# Patient Record
Sex: Male | Born: 1995 | Race: White | Hispanic: No | Marital: Single | Smoking: Former smoker
Health system: Southern US, Community
[De-identification: ages and names within clinical notes are randomized; demographics above are authoritative.]

## PROBLEM LIST (undated history)

## (undated) ENCOUNTER — Emergency Department: Discharge status: Absent without leave | Payer: Self-pay

## (undated) DIAGNOSIS — F909 Attention-deficit hyperactivity disorder, unspecified type: Secondary | ICD-10-CM

## (undated) DIAGNOSIS — K3532 Acute appendicitis with perforation and localized peritonitis, without abscess: Secondary | ICD-10-CM

## (undated) DIAGNOSIS — F1910 Other psychoactive substance abuse, uncomplicated: Secondary | ICD-10-CM

## (undated) DIAGNOSIS — K37 Unspecified appendicitis: Secondary | ICD-10-CM

## (undated) DIAGNOSIS — K3533 Acute appendicitis with perforation and localized peritonitis, with abscess: Secondary | ICD-10-CM

## (undated) HISTORY — DX: Acute appendicitis with perforation, localized peritonitis, and gangrene, without abscess: K35.32

## (undated) HISTORY — DX: Unspecified appendicitis: K37

## (undated) HISTORY — DX: Other psychoactive substance abuse, uncomplicated: F19.10

---
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
## 2014-01-25 ENCOUNTER — Emergency Department: Payer: Self-pay | Admitting: Emergency Medicine

## 2014-04-20 DIAGNOSIS — F1910 Other psychoactive substance abuse, uncomplicated: Secondary | ICD-10-CM

## 2014-04-20 HISTORY — DX: Other psychoactive substance abuse, uncomplicated: F19.10

## 2014-07-09 ENCOUNTER — Emergency Department: Payer: Self-pay | Admitting: Emergency Medicine

## 2017-01-10 DIAGNOSIS — K3533 Acute appendicitis with perforation and localized peritonitis, with abscess: Secondary | ICD-10-CM

## 2017-01-10 HISTORY — DX: Acute appendicitis with perforation, localized peritonitis, and gangrene, with abscess: K35.33

## 2017-01-13 ENCOUNTER — Encounter: Payer: Self-pay | Admitting: Intensive Care

## 2017-01-13 ENCOUNTER — Inpatient Hospital Stay
Admission: EM | Admit: 2017-01-13 | Discharge: 2017-01-20 | DRG: 358 | Disposition: A | Discharge status: Home / Self Care | Payer: Self-pay | Attending: General Surgery | Admitting: General Surgery

## 2017-01-13 ENCOUNTER — Emergency Department: Payer: Self-pay

## 2017-01-13 DIAGNOSIS — R1084 Generalized abdominal pain: Secondary | ICD-10-CM

## 2017-01-13 DIAGNOSIS — L0291 Cutaneous abscess, unspecified: Secondary | ICD-10-CM

## 2017-01-13 DIAGNOSIS — Z833 Family history of diabetes mellitus: Secondary | ICD-10-CM

## 2017-01-13 DIAGNOSIS — Z8249 Family history of ischemic heart disease and other diseases of the circulatory system: Secondary | ICD-10-CM

## 2017-01-13 DIAGNOSIS — K3532 Acute appendicitis with perforation and localized peritonitis, without abscess: Secondary | ICD-10-CM | POA: Diagnosis present

## 2017-01-13 DIAGNOSIS — K353 Acute appendicitis with localized peritonitis: Principal | ICD-10-CM | POA: Diagnosis present

## 2017-01-13 DIAGNOSIS — K352 Acute appendicitis with generalized peritonitis: Secondary | ICD-10-CM

## 2017-01-13 HISTORY — DX: Attention-deficit hyperactivity disorder, unspecified type: F90.9

## 2017-01-13 LAB — URINALYSIS, COMPLETE (UACMP) WITH MICROSCOPIC
BILIRUBIN URINE: NEGATIVE
Bacteria, UA: NONE SEEN
Glucose, UA: NEGATIVE mg/dL
HGB URINE DIPSTICK: NEGATIVE
Ketones, ur: NEGATIVE mg/dL
LEUKOCYTES UA: NEGATIVE
NITRITE: NEGATIVE
PROTEIN: 30 mg/dL — AB
RBC / HPF: NONE SEEN RBC/hpf (ref 0–5)
Specific Gravity, Urine: 1.026 (ref 1.005–1.030)
Squamous Epithelial / LPF: NONE SEEN
pH: 6 (ref 5.0–8.0)

## 2017-01-13 LAB — COMPREHENSIVE METABOLIC PANEL
ALT: 22 U/L (ref 17–63)
ANION GAP: 9 (ref 5–15)
AST: 25 U/L (ref 15–41)
Albumin: 3.4 g/dL — ABNORMAL LOW (ref 3.5–5.0)
Alkaline Phosphatase: 96 U/L (ref 38–126)
BUN: 15 mg/dL (ref 6–20)
CHLORIDE: 98 mmol/L — AB (ref 101–111)
CO2: 29 mmol/L (ref 22–32)
Calcium: 9 mg/dL (ref 8.9–10.3)
Creatinine, Ser: 0.96 mg/dL (ref 0.61–1.24)
GFR calc non Af Amer: >60 mL/min (ref >60)
Glucose, Bld: 115 mg/dL — ABNORMAL HIGH (ref 65–99)
Potassium: 3.4 mmol/L — ABNORMAL LOW (ref 3.5–5.1)
SODIUM: 136 mmol/L (ref 135–145)
Total Bilirubin: 1.2 mg/dL (ref 0.3–1.2)
Total Protein: 7.9 g/dL (ref 6.5–8.1)

## 2017-01-13 LAB — CBC WITH DIFFERENTIAL/PLATELET
BASOS ABS: 0 10*3/uL (ref 0–0.1)
Basophils Relative: 0 %
Eosinophils Absolute: 0 10*3/uL (ref 0–0.7)
Eosinophils Relative: 0 %
HCT: 39.3 % — ABNORMAL LOW (ref 40.0–52.0)
Hemoglobin: 13.3 g/dL (ref 13.0–18.0)
LYMPHS ABS: 2.1 10*3/uL (ref 1.0–3.6)
Lymphocytes Relative: 8 %
MCH: 29 pg (ref 26.0–34.0)
MCHC: 33.9 g/dL (ref 32.0–36.0)
MCV: 85.6 fL (ref 80.0–100.0)
MONO ABS: 2.9 10*3/uL — AB (ref 0.2–1.0)
Monocytes Relative: 11 %
Neutro Abs: 21.1 10*3/uL — ABNORMAL HIGH (ref 1.4–6.5)
Neutrophils Relative %: 81 %
PLATELETS: 328 10*3/uL (ref 150–440)
RBC: 4.59 MIL/uL (ref 4.40–5.90)
RDW: 13.1 % (ref 11.5–14.5)
WBC: 26.1 10*3/uL — AB (ref 3.8–10.6)

## 2017-01-13 LAB — LIPASE, BLOOD: LIPASE: 11 U/L (ref 11–51)

## 2017-01-13 MED ORDER — DIPHENHYDRAMINE HCL 50 MG/ML IJ SOLN: 25.0000 mg | Freq: Four times a day (QID) | INTRAMUSCULAR | Status: DC | PRN

## 2017-01-13 MED ORDER — PIPERACILLIN-TAZOBACTAM 3.375 G IVPB: 3.3750 g | Freq: Three times a day (TID) | INTRAVENOUS | Status: DC

## 2017-01-13 MED ORDER — ONDANSETRON 4 MG PO TBDP
4.0000 mg | ORAL_TABLET | Freq: Four times a day (QID) | ORAL | Status: DC | PRN
Start: 2017-01-13 — End: 2017-01-14

## 2017-01-13 MED ORDER — MORPHINE SULFATE (PF) 4 MG/ML IV SOLN
INTRAVENOUS | Status: AC
  Administered 2017-01-13: 4 mg via INTRAVENOUS
  Filled 2017-01-13: qty 1

## 2017-01-13 MED ORDER — HYDRALAZINE HCL 20 MG/ML IJ SOLN: 10.0000 mg | INTRAMUSCULAR | Status: DC | PRN

## 2017-01-13 MED ORDER — DIPHENHYDRAMINE HCL 25 MG PO CAPS: 25.0000 mg | ORAL_CAPSULE | Freq: Four times a day (QID) | ORAL | Status: DC | PRN

## 2017-01-13 MED ORDER — ONDANSETRON HCL 4 MG/2ML IJ SOLN
4.0000 mg | Freq: Four times a day (QID) | INTRAMUSCULAR | Status: DC | PRN
Start: 2017-01-13 — End: 2017-01-14
  Administered 2017-01-14: 4 mg via INTRAVENOUS
  Filled 2017-01-13: qty 2

## 2017-01-13 MED ORDER — MELATONIN 5 MG PO TABS
10.0000 mg | ORAL_TABLET | Freq: Every evening | ORAL | Status: DC | PRN
  Administered 2017-01-13 – 2017-01-20 (×5): 10 mg via ORAL
  Filled 2017-01-13 (×6): qty 2

## 2017-01-13 MED ORDER — PIPERACILLIN-TAZOBACTAM 3.375 G IVPB
INTRAVENOUS | Status: AC
  Administered 2017-01-13: 3.375 g via INTRAVENOUS
  Filled 2017-01-13: qty 50

## 2017-01-13 MED ORDER — MORPHINE SULFATE (PF) 4 MG/ML IV SOLN
4.0000 mg | Freq: Once | INTRAVENOUS | Status: AC
  Administered 2017-01-13: 4 mg via INTRAVENOUS

## 2017-01-13 MED ORDER — IOPAMIDOL (ISOVUE-300) INJECTION 61%
30.0000 mL | Freq: Once | INTRAVENOUS | Status: AC | PRN
  Administered 2017-01-13: 30 mL via ORAL
  Filled 2017-01-13: qty 30

## 2017-01-13 MED ORDER — DEXTROSE IN LACTATED RINGERS 5 % IV SOLN
INTRAVENOUS | Status: DC
  Administered 2017-01-13 – 2017-01-17 (×8): via INTRAVENOUS

## 2017-01-13 MED ORDER — ENOXAPARIN SODIUM 40 MG/0.4ML ~~LOC~~ SOLN
40.0000 mg | SUBCUTANEOUS | Status: DC
  Administered 2017-01-13 – 2017-01-19 (×7): 40 mg via SUBCUTANEOUS
  Filled 2017-01-13 (×7): qty 0.4

## 2017-01-13 MED ORDER — DICYCLOMINE HCL 10 MG PO CAPS
10.0000 mg | ORAL_CAPSULE | Freq: Once | ORAL | Status: AC
  Administered 2017-01-13: 10 mg via ORAL
  Filled 2017-01-13: qty 1

## 2017-01-13 MED ORDER — DEXTROSE 5 % IV SOLN
3.3750 g | Freq: Three times a day (TID) | INTRAVENOUS | Status: DC
  Administered 2017-01-13 – 2017-01-14 (×2): 3.375 g via INTRAVENOUS
  Filled 2017-01-13 (×6): qty 3.38

## 2017-01-13 MED ORDER — ACETAMINOPHEN 325 MG PO TABS
650.0000 mg | ORAL_TABLET | ORAL | Status: DC | PRN
  Administered 2017-01-13: 650 mg via ORAL
  Filled 2017-01-13: qty 2

## 2017-01-13 MED ORDER — MORPHINE SULFATE (PF) 4 MG/ML IV SOLN
4.0000 mg | INTRAVENOUS | Status: DC | PRN
  Administered 2017-01-13 – 2017-01-15 (×10): 4 mg via INTRAVENOUS
  Filled 2017-01-13 (×9): qty 1

## 2017-01-13 MED ORDER — PIPERACILLIN-TAZOBACTAM 3.375 G IVPB 30 MIN
3.3750 g | Freq: Once | INTRAVENOUS | Status: AC
  Administered 2017-01-13: 3.375 g via INTRAVENOUS

## 2017-01-13 MED ORDER — IOPAMIDOL (ISOVUE-300) INJECTION 61%
100.0000 mL | Freq: Once | INTRAVENOUS | Status: AC | PRN
  Administered 2017-01-13: 100 mL via INTRAVENOUS
  Filled 2017-01-13: qty 100

## 2017-01-13 NOTE — H&P (Signed)
Patient ID: Jimmy Leach, male   DOB: 15-Aug-1996, 21 y.o.   MRN: [id number]  CC: ABDOMINAL PAIN  HPI [name] is a 21 y.o. male who presented to the emergency department today for abdominal pain. Patient states he's been having some abdominal pain for the last 3 weeks but today the pain worsened dramatically to the point that he could not handle it anymore. He states that the pain actually peaked on Sunday but then went away and allowed to be traversed have many days. He has been using marijuana for anxiety and pain control. She is a decreased appetite, nausea, vomiting, loose stools. He denies any fevers but he has had chills. Denies any chest pain, shortness of breath, cough. He is otherwise in his usual state of excellent health.  HPI  Past Medical History:  Diagnosis Date  . ADHD     History reviewed. No pertinent surgical history. No prior surgeries.  History reviewed. No pertinent family history. Patient reports a remote family history of heart disease and diabetes. He denies any known history of cancers.  Social History Social History  Substance Use Topics  . Smoking status: Never Smoker  . Smokeless tobacco: Never Used  . Alcohol use Yes     Comment: occ    No Known Allergies  Current Facility-Administered Medications  Medication Dose Route Frequency Provider Last Rate Last Dose  . piperacillin-tazobactam (ZOSYN) IVPB 3.375 g  3.375 g Intravenous Once Phineas Semen, MD 100 mL/hr at 01/13/17 1810 3.375 g at 01/13/17 1810   No current outpatient prescriptions on file.     Review of Systems A Multi-point review of systems was asked and was negative except for the findings documented in the history of present illness  Physical Exam Blood pressure 125/88, pulse 100, temperature 100 F (37.8 C), temperature source Oral, resp. rate 20, height  (1.702 m), weight 65.8 kg (145 lb), SpO2 98 %. CONSTITUTIONAL: No acute distress. EYES: Pupils are equal,  round, and reactive to light, Sclera are non-icteric. EARS, NOSE, MOUTH AND THROAT: The oropharynx is clear. The oral mucosa is pink and moist. Hearing is intact to voice. LYMPH NODES:  Lymph nodes in the neck are normal. RESPIRATORY:  Lungs are clear. There is normal respiratory effort, with equal breath sounds bilaterally, and without pathologic use of accessory muscles. CARDIOVASCULAR: Heart is regular without murmurs, gallops, or rubs. GI: The abdomen is soft, tender to palpation in the right lower quadrant with a positive McBurney sign but negative Rovsing or heeltap, and nondistended. There are no palpable masses. There is no hepatosplenomegaly. There are normal bowel sounds in all quadrants. GU: Rectal deferred.   MUSCULOSKELETAL: Normal muscle strength and tone. No cyanosis or edema.   SKIN: Turgor is good and there are no pathologic skin lesions or ulcers. NEUROLOGIC: Motor and sensation is grossly normal. Cranial nerves are grossly intact. PSYCH:  Oriented to person, place and time. Affect is normal.  Data Reviewed Images and labs reviewed. Labs concerning for leukocytosis of 26.1, mild hypokalemia at 3.4, hypochloremia of 98. CT scan of the abdomen shows a large inflammatory process in the right lower quadrant with what appears to be a fecalith within the middle of it consistent with a ruptured appendicitis with periappendiceal abscess. I have personally reviewed the patient's imaging, laboratory findings and medical records.    Assessment    Ruptured appendicitis    Plan    21 year old male with a ruptured appendicitis. Discussed the diagnosis at length and  in detail to the patient to include the treatment options. The patient's CT scan was reviewed with the interventional radiologist who feels there is not a safe window to drain this abscess percutaneously. Given this discussed with patient the need to admit him to the hospital for resuscitation and IV antibiotics. After he has  been resuscitated overnight with implant taken to the operating room for attempted laparoscopic appendectomy and drainage of pelvic abscess. Discussed at length with the patient the high risk for needing to perform an open procedure. Discussed the likelihood of him requiring a prolonged hospital stay secondary to the abscess. Patient voiced understanding and agrees with this plan. He will be admitted to the surgical service, lot have a clear liquid diet until midnight and then nothing by mouth, IV antibiotics, IV hydration, proceed to the operating room in the morning for surgery.     Time spent with the patient was 50 minutes, with more than 50% of the time spent in face-to-face education, counseling and care coordination.     Ricarda Frame, MD FACS General Surgeon 01/13/2017, 6:34 PM

## 2017-01-13 NOTE — ED Notes (Signed)
Pt reports his pain is returning, pt states he is not able to get comfortable. EDP notified, VS stable. See MAR for follow up. Pt notified pain medication is ordered every 4hrs and this dose being administered at this time is to help with comfort and transport to room. Pt verbalized understanding of this.

## 2017-01-13 NOTE — ED Triage Notes (Signed)
Patient presents to ER with c/o abdominal pain all over that started X3 weeks ago. Reports pain is worse in AM. Has been having N/V/D. Ambulatory with NAD noted

## 2017-01-13 NOTE — ED Notes (Signed)
Dr. Woodham at bedside. 

## 2017-01-13 NOTE — ED Notes (Signed)
EDP at bedside  

## 2017-01-13 NOTE — ED Provider Notes (Signed)
[facility] Emergency Department Provider Note   ____________________________________________   I have reviewed the triage vital signs and the nursing notes.   HISTORY  Chief Complaint Abdominal Pain   History limited by: Not Limited   HPI Jimmy Leach is a 21 y.o. male who presents to the emergency department today because of concern for abdominal pain. The pain has been present for the past three weeks. It was located initially in the upper part of the abdomen but has "rolled" down his stomach. It has been accompanied by nausea and vomiting and is usually worse in the morning. He has had a decreased apetite with the pain. He has not had any significant diarrhea, blood in stool or change in stooling. No fevers. He has tried ibuprofen, pepto bismal, immodium and laxative without any significant relief. Has not had pain like this before.    Past Medical History:  Diagnosis Date  . ADHD     There are no active problems to display for this patient.   History reviewed. No pertinent surgical history.  Prior to Admission medications   Not on File    Allergies Patient has no known allergies.  History reviewed. No pertinent family history.  Social History Social History  Substance Use Topics  . Smoking status: Never Smoker  . Smokeless tobacco: Never Used  . Alcohol use Yes     Comment: occ    Review of Systems  Constitutional: Negative for fever. Cardiovascular: Negative for chest pain. Respiratory: Negative for shortness of breath. Gastrointestinal: Positive for abdominal pain, nausea and vomiting. Genitourinary: Negative for dysuria. Musculoskeletal: Negative for back pain. Skin: Negative for rash. Neurological: Negative for headaches, focal weakness or numbness.  10-point ROS otherwise negative.  ____________________________________________   PHYSICAL EXAM:  VITAL SIGNS: ED Triage Vitals  Enc Vitals Group     BP 01/13/17  1412 137/82     Pulse Rate 01/13/17 1412 96     Resp 01/13/17 1412 20     Temp 01/13/17 1412 99.6 F (37.6 C)     Temp Source 01/13/17 1412 Oral     SpO2 01/13/17 1412 98 %     Weight 01/13/17 1413 145 lb (65.8 kg)     Height 01/13/17 1413  (1.702 m)     Head Circumference --      Peak Flow --      Pain Score 01/13/17 1420 2    Constitutional: Alert and oriented. Well appearing and in no distress. Eyes: Conjunctivae are normal. Normal extraocular movements. ENT   Head: Normocephalic and atraumatic.   Nose: No congestion/rhinnorhea.   Mouth/Throat: Mucous membranes are moist.   Neck: No stridor. Hematological/Lymphatic/Immunilogical: No cervical lymphadenopathy. Cardiovascular: Normal rate, regular rhythm.  No murmurs, rubs, or gallops.  Respiratory: Normal respiratory effort without tachypnea nor retractions. Breath sounds are clear and equal bilaterally. No wheezes/rales/rhonchi. Gastrointestinal: Soft and diffusely tender to palpation. No rebound. No guarding.  Genitourinary: Deferred Musculoskeletal: Normal range of motion in all extremities. No lower extremity edema. Neurologic:  Normal speech and language. No gross focal neurologic deficits are appreciated.  Skin:  Skin is warm, dry and intact. No rash noted. Psychiatric: Mood and affect are normal. Speech and behavior are normal. Patient exhibits appropriate insight and judgment.  ____________________________________________    LABS (pertinent positives/negatives)  Labs Reviewed  COMPREHENSIVE METABOLIC PANEL - Abnormal; Notable for the following:       Result Value   Potassium 3.4 (*)    Chloride 98 (*)  Glucose, Bld 115 (*)    Albumin 3.4 (*)    All other components within normal limits  URINALYSIS, COMPLETE (UACMP) WITH MICROSCOPIC - Abnormal; Notable for the following:    Color, Urine AMBER (*)    APPearance CLEAR (*)    Protein, ur 30 (*)    All other components within normal limits  CBC  WITH DIFFERENTIAL/PLATELET - Abnormal; Notable for the following:    WBC 26.1 (*)    HCT 39.3 (*)    Neutro Abs 21.1 (*)    Monocytes Absolute 2.9 (*)    All other components within normal limits  LIPASE, BLOOD     ____________________________________________   EKG  None  ____________________________________________    RADIOLOGY  CT abd/pel IMPRESSION:  Large inflammatory process identified in the RIGHT pelvis adjacent  to the cecum and terminal ileum most likely representing perforated  appendicitis with secondary inflammatory changes and a large  periappendiceal abscess 7.0 x 4.3 x 6.8 cm.     I, Mariam Helbert, personally discussed these images and results by phone with the on-call radiologist and used this discussion as part of my medical decision making.  ____________________________________________   PROCEDURES  Procedures  ____________________________________________   INITIAL IMPRESSION / ASSESSMENT AND PLAN / ED COURSE  Pertinent labs & imaging results that were available during my care of the patient were reviewed by me and considered in my medical decision making (see chart for details).  Patient presented to the emergency department today because of concerns for abdominal pain this been going on for the past couple of weeks. Exam the patient had somewhat diffuse abdominal tenderness. He had a significant leukocytosis and the CT scan was obtained. This did show findings perhaps consistent with a ruptured appendicitis and abscess. Patient will be admitted under the care of the surgical team.  ____________________________________________   FINAL CLINICAL IMPRESSION(S) / ED DIAGNOSES  Final diagnoses:  Generalized abdominal pain  Abscess     Note: This dictation was prepared with Dragon dictation. Any transcriptional errors that result from this process are unintentional     Phineas Semen, MD 01/13/17 1756

## 2017-01-14 ENCOUNTER — Encounter
Admission: EM | Disposition: A | Discharge status: Home / Self Care | Payer: Self-pay | Source: Home / Self Care | Attending: General Surgery

## 2017-01-14 ENCOUNTER — Inpatient Hospital Stay: Payer: Self-pay | Admitting: Certified Registered"

## 2017-01-14 ENCOUNTER — Encounter: Payer: Self-pay | Admitting: Anesthesiology

## 2017-01-14 DIAGNOSIS — L0291 Cutaneous abscess, unspecified: Secondary | ICD-10-CM

## 2017-01-14 HISTORY — PX: LAPAROSCOPIC APPENDECTOMY: SHX408

## 2017-01-14 LAB — CBC
HCT: 35.7 % — ABNORMAL LOW (ref 40.0–52.0)
Hemoglobin: 12 g/dL — ABNORMAL LOW (ref 13.0–18.0)
MCH: 28.7 pg (ref 26.0–34.0)
MCHC: 33.6 g/dL (ref 32.0–36.0)
MCV: 85.3 fL (ref 80.0–100.0)
PLATELETS: 291 10*3/uL (ref 150–440)
RBC: 4.19 MIL/uL — AB (ref 4.40–5.90)
RDW: 12.8 % (ref 11.5–14.5)
WBC: 22 10*3/uL — AB (ref 3.8–10.6)

## 2017-01-14 LAB — SURGICAL PCR SCREEN
MRSA, PCR: NEGATIVE
STAPHYLOCOCCUS AUREUS: POSITIVE — AB

## 2017-01-14 LAB — COMPREHENSIVE METABOLIC PANEL
ALT: 16 U/L — AB (ref 17–63)
AST: 17 U/L (ref 15–41)
Albumin: 2.7 g/dL — ABNORMAL LOW (ref 3.5–5.0)
Alkaline Phosphatase: 80 U/L (ref 38–126)
Anion gap: 5 (ref 5–15)
BILIRUBIN TOTAL: 1.6 mg/dL — AB (ref 0.3–1.2)
BUN: 8 mg/dL (ref 6–20)
CO2: 28 mmol/L (ref 22–32)
CREATININE: 0.85 mg/dL (ref 0.61–1.24)
Calcium: 8.3 mg/dL — ABNORMAL LOW (ref 8.9–10.3)
Chloride: 101 mmol/L (ref 101–111)
Glucose, Bld: 141 mg/dL — ABNORMAL HIGH (ref 65–99)
POTASSIUM: 3.3 mmol/L — AB (ref 3.5–5.1)
Sodium: 134 mmol/L — ABNORMAL LOW (ref 135–145)
TOTAL PROTEIN: 6.5 g/dL (ref 6.5–8.1)

## 2017-01-14 SURGERY — APPENDECTOMY, LAPAROSCOPIC
Anesthesia: General | Wound class: Clean Contaminated

## 2017-01-14 MED ORDER — PIPERACILLIN-TAZOBACTAM 3.375 G IVPB
INTRAVENOUS | Status: AC
  Administered 2017-01-14: 3.375 g via INTRAVENOUS
  Filled 2017-01-14: qty 50

## 2017-01-14 MED ORDER — FENTANYL CITRATE (PF) 100 MCG/2ML IJ SOLN: 25.0000 ug | INTRAMUSCULAR | Status: DC | PRN

## 2017-01-14 MED ORDER — BUPIVACAINE-EPINEPHRINE (PF) 0.25% -1:200000 IJ SOLN
INTRAMUSCULAR | Status: AC
  Filled 2017-01-14: qty 30

## 2017-01-14 MED ORDER — MIDAZOLAM HCL 2 MG/2ML IJ SOLN
INTRAMUSCULAR | Status: AC
  Filled 2017-01-14: qty 2

## 2017-01-14 MED ORDER — LIDOCAINE HCL (CARDIAC) 20 MG/ML IV SOLN
INTRAVENOUS | Status: DC | PRN
  Administered 2017-01-14: 60 mg via INTRAVENOUS

## 2017-01-14 MED ORDER — SUGAMMADEX SODIUM 200 MG/2ML IV SOLN
INTRAVENOUS | Status: DC | PRN
  Administered 2017-01-14: 120 mg via INTRAVENOUS

## 2017-01-14 MED ORDER — FENTANYL CITRATE (PF) 100 MCG/2ML IJ SOLN
INTRAMUSCULAR | Status: AC
  Administered 2017-01-14: 25 ug via INTRAVENOUS
  Filled 2017-01-14: qty 2

## 2017-01-14 MED ORDER — LACTATED RINGERS IV SOLN
INTRAVENOUS | Status: DC | PRN
  Administered 2017-01-14: 12:00:00 via INTRAVENOUS

## 2017-01-14 MED ORDER — SUGAMMADEX SODIUM 200 MG/2ML IV SOLN
INTRAVENOUS | Status: AC
  Filled 2017-01-14: qty 2

## 2017-01-14 MED ORDER — HYDROCODONE-ACETAMINOPHEN 5-325 MG PO TABS
1.0000 | ORAL_TABLET | ORAL | Status: DC | PRN
Start: 2017-01-14 — End: 2017-01-20
  Administered 2017-01-14 – 2017-01-15 (×4): 1 via ORAL
  Administered 2017-01-16 – 2017-01-19 (×8): 2 via ORAL
  Administered 2017-01-19: 1 via ORAL
  Administered 2017-01-19 – 2017-01-20 (×2): 2 via ORAL
  Filled 2017-01-14: qty 1
  Filled 2017-01-14 (×3): qty 2
  Filled 2017-01-14: qty 1
  Filled 2017-01-14: qty 2
  Filled 2017-01-14: qty 1
  Filled 2017-01-14 (×7): qty 2
  Filled 2017-01-14 (×4): qty 1

## 2017-01-14 MED ORDER — SEVOFLURANE IN SOLN
RESPIRATORY_TRACT | Status: AC
  Filled 2017-01-14: qty 250

## 2017-01-14 MED ORDER — DEXAMETHASONE SODIUM PHOSPHATE 10 MG/ML IJ SOLN
INTRAMUSCULAR | Status: DC | PRN
  Administered 2017-01-14: 10 mg via INTRAVENOUS

## 2017-01-14 MED ORDER — OXYCODONE HCL 5 MG/5ML PO SOLN: 5.0000 mg | Freq: Once | ORAL | Status: DC | PRN

## 2017-01-14 MED ORDER — PROPOFOL 10 MG/ML IV BOLUS
INTRAVENOUS | Status: DC | PRN
  Administered 2017-01-14: 160 mg via INTRAVENOUS

## 2017-01-14 MED ORDER — ONDANSETRON HCL 4 MG/2ML IJ SOLN
4.0000 mg | Freq: Once | INTRAMUSCULAR | Status: AC | PRN
  Administered 2017-01-16: 4 mg via INTRAVENOUS
  Filled 2017-01-14: qty 2

## 2017-01-14 MED ORDER — ROCURONIUM BROMIDE 100 MG/10ML IV SOLN
INTRAVENOUS | Status: DC | PRN
  Administered 2017-01-14: 10 mg via INTRAVENOUS
  Administered 2017-01-14: 40 mg via INTRAVENOUS
  Administered 2017-01-14: 10 mg via INTRAVENOUS

## 2017-01-14 MED ORDER — LIDOCAINE HCL (PF) 1 % IJ SOLN
INTRAMUSCULAR | Status: AC
  Filled 2017-01-14: qty 30

## 2017-01-14 MED ORDER — FENTANYL CITRATE (PF) 100 MCG/2ML IJ SOLN
INTRAMUSCULAR | Status: DC | PRN
  Administered 2017-01-14: 100 ug via INTRAVENOUS
  Administered 2017-01-14: 25 ug via INTRAVENOUS
  Administered 2017-01-14: 50 ug via INTRAVENOUS
  Administered 2017-01-14: 25 ug via INTRAVENOUS

## 2017-01-14 MED ORDER — FENTANYL CITRATE (PF) 100 MCG/2ML IJ SOLN
25.0000 ug | INTRAMUSCULAR | Status: DC | PRN
  Administered 2017-01-14 (×2): 25 ug via INTRAVENOUS
  Administered 2017-01-14: 50 ug via INTRAVENOUS
  Administered 2017-01-14 (×2): 25 ug via INTRAVENOUS

## 2017-01-14 MED ORDER — FENTANYL CITRATE (PF) 100 MCG/2ML IJ SOLN
INTRAMUSCULAR | Status: AC
  Filled 2017-01-14: qty 2

## 2017-01-14 MED ORDER — LIDOCAINE HCL (PF) 2 % IJ SOLN
INTRAMUSCULAR | Status: AC
  Filled 2017-01-14: qty 2

## 2017-01-14 MED ORDER — ONDANSETRON HCL 4 MG/2ML IJ SOLN
INTRAMUSCULAR | Status: DC | PRN
  Administered 2017-01-14: 4 mg via INTRAVENOUS

## 2017-01-14 MED ORDER — DEXAMETHASONE SODIUM PHOSPHATE 10 MG/ML IJ SOLN
INTRAMUSCULAR | Status: AC
  Filled 2017-01-14: qty 1

## 2017-01-14 MED ORDER — OXYCODONE HCL 5 MG PO TABS: 5.0000 mg | ORAL_TABLET | Freq: Once | ORAL | Status: DC | PRN

## 2017-01-14 MED ORDER — ACETAMINOPHEN 10 MG/ML IV SOLN
INTRAVENOUS | Status: DC | PRN
  Administered 2017-01-14: 1000 mg via INTRAVENOUS

## 2017-01-14 MED ORDER — CHLORHEXIDINE GLUCONATE CLOTH 2 % EX PADS
6.0000 | MEDICATED_PAD | Freq: Every day | CUTANEOUS | Status: DC
  Administered 2017-01-14: 6 via TOPICAL

## 2017-01-14 MED ORDER — BUPIVACAINE-EPINEPHRINE (PF) 0.25% -1:200000 IJ SOLN
INTRAMUSCULAR | Status: DC | PRN
  Administered 2017-01-14: 7 mL

## 2017-01-14 MED ORDER — PROPOFOL 10 MG/ML IV BOLUS
INTRAVENOUS | Status: AC
  Filled 2017-01-14: qty 20

## 2017-01-14 MED ORDER — SODIUM CHLORIDE 0.9 % IV SOLN
30.0000 meq | Freq: Once | INTRAVENOUS | Status: AC
  Administered 2017-01-14: 30 meq via INTRAVENOUS
  Filled 2017-01-14: qty 15

## 2017-01-14 MED ORDER — MUPIROCIN 2 % EX OINT
1.0000 "application " | TOPICAL_OINTMENT | Freq: Two times a day (BID) | CUTANEOUS | Status: AC
  Administered 2017-01-14 – 2017-01-18 (×9): 1 via NASAL
  Filled 2017-01-14: qty 22

## 2017-01-14 MED ORDER — MIDAZOLAM HCL 2 MG/2ML IJ SOLN
INTRAMUSCULAR | Status: DC | PRN
  Administered 2017-01-14: 2 mg via INTRAVENOUS

## 2017-01-14 MED ORDER — ACETAMINOPHEN 10 MG/ML IV SOLN
INTRAVENOUS | Status: AC
  Filled 2017-01-14: qty 100

## 2017-01-14 MED ORDER — KETOROLAC TROMETHAMINE 30 MG/ML IJ SOLN
INTRAMUSCULAR | Status: DC | PRN
  Administered 2017-01-14: 30 mg via INTRAVENOUS

## 2017-01-14 MED ORDER — ROCURONIUM BROMIDE 50 MG/5ML IV SOLN
INTRAVENOUS | Status: AC
  Filled 2017-01-14: qty 1

## 2017-01-14 MED ORDER — ONDANSETRON HCL 4 MG/2ML IJ SOLN
INTRAMUSCULAR | Status: AC
  Filled 2017-01-14: qty 2

## 2017-01-14 MED ORDER — LIDOCAINE HCL 1 % IJ SOLN
INTRAMUSCULAR | Status: DC | PRN
  Administered 2017-01-14: 7 mL

## 2017-01-14 MED ORDER — KETOROLAC TROMETHAMINE 30 MG/ML IJ SOLN
INTRAMUSCULAR | Status: AC
  Filled 2017-01-14: qty 1

## 2017-01-14 MED ORDER — PIPERACILLIN-TAZOBACTAM 3.375 G IVPB 30 MIN
3.3750 g | Freq: Three times a day (TID) | INTRAVENOUS | Status: DC
  Administered 2017-01-14 – 2017-01-20 (×17): 3.375 g via INTRAVENOUS
  Filled 2017-01-14 (×20): qty 50

## 2017-01-14 MED ORDER — PIPERACILLIN-TAZOBACTAM 3.375 G IVPB
3.3750 g | Freq: Once | INTRAVENOUS | Status: AC
  Administered 2017-01-14: 3.375 g via INTRAVENOUS

## 2017-01-14 SURGICAL SUPPLY — 58 items
ADHESIVE MASTISOL STRL (MISCELLANEOUS) ×3 IMPLANT
APPLIER CLIP 5 13 M/L LIGAMAX5 (MISCELLANEOUS)
BLADE SURG SZ11 CARB STEEL (BLADE) ×3 IMPLANT
BULB RESERV EVAC DRAIN JP 100C (MISCELLANEOUS) ×6 IMPLANT
CANISTER SUCT 1200ML W/VALVE (MISCELLANEOUS) ×3 IMPLANT
CATH TRAY 16F METER LATEX (MISCELLANEOUS) ×3 IMPLANT
CHLORAPREP W/TINT 26ML (MISCELLANEOUS) ×3 IMPLANT
CLIP APPLIE 5 13 M/L LIGAMAX5 (MISCELLANEOUS) IMPLANT
CLOSURE WOUND 1/2 X4 (GAUZE/BANDAGES/DRESSINGS)
CUTTER FLEX LINEAR 45M (STAPLE) IMPLANT
DRAIN CHANNEL JP 19F (MISCELLANEOUS) ×6 IMPLANT
DRAPE LAPAROTOMY 100X77 ABD (DRAPES) ×3 IMPLANT
DRSG TEGADERM 2-3/8X2-3/4 SM (GAUZE/BANDAGES/DRESSINGS) ×9 IMPLANT
DRSG TELFA 3X8 NADH (GAUZE/BANDAGES/DRESSINGS) IMPLANT
DRSG TELFA 4X3 1S NADH ST (GAUZE/BANDAGES/DRESSINGS) ×3 IMPLANT
ELECT REM PT RETURN 9FT ADLT (ELECTROSURGICAL) ×3
ELECTRODE REM PT RTRN 9FT ADLT (ELECTROSURGICAL) ×1 IMPLANT
ENDOPOUCH RETRIEVER 10 (MISCELLANEOUS) ×3 IMPLANT
GAUZE SPONGE 4X4 12PLY STRL (GAUZE/BANDAGES/DRESSINGS) IMPLANT
GLOVE BIO SURGEON STRL SZ7.5 (GLOVE) ×18 IMPLANT
GLOVE BIO SURGEON STRL SZ8 (GLOVE) ×3 IMPLANT
GLOVE INDICATOR 8.0 STRL GRN (GLOVE) ×3 IMPLANT
GOWN STRL REUS W/ TWL LRG LVL3 (GOWN DISPOSABLE) ×5 IMPLANT
GOWN STRL REUS W/TWL LRG LVL3 (GOWN DISPOSABLE) ×10
IRRIGATION STRYKERFLOW (MISCELLANEOUS) IMPLANT
IRRIGATOR STRYKERFLOW (MISCELLANEOUS)
IV NS 1000ML (IV SOLUTION) ×2
IV NS 1000ML BAXH (IV SOLUTION) ×1 IMPLANT
KIT RM TURNOVER STRD PROC AR (KITS) ×3 IMPLANT
LABEL OR EYE ROOM STRL (LABEL) IMPLANT
LABEL OR SOLS (LABEL) ×3 IMPLANT
NDL SAFETY 22GX1.5 (NEEDLE) IMPLANT
NEEDLE HYPO 25X1 1.5 SAFETY (NEEDLE) ×3 IMPLANT
NEEDLE VERESS 14GA 120MM (NEEDLE) ×3 IMPLANT
NS IRRIG 1000ML POUR BTL (IV SOLUTION) ×3 IMPLANT
NS IRRIG 500ML POUR BTL (IV SOLUTION) ×3 IMPLANT
PACK BASIN MINOR ARMC (MISCELLANEOUS) IMPLANT
PACK LAP CHOLECYSTECTOMY (MISCELLANEOUS) ×3 IMPLANT
RELOAD 45 VASCULAR/THIN (ENDOMECHANICALS) IMPLANT
RELOAD STAPLE TA45 3.5 REG BLU (ENDOMECHANICALS) IMPLANT
SCALPEL HARMONIC ACE (MISCELLANEOUS) ×3 IMPLANT
SLEEVE ENDOPATH XCEL 5M (ENDOMECHANICALS) ×3 IMPLANT
SPONGE DRAIN TRACH 4X4 STRL 2S (GAUZE/BANDAGES/DRESSINGS) ×6 IMPLANT
SPONGE LAP 18X18 5 PK (GAUZE/BANDAGES/DRESSINGS) IMPLANT
STAPLER SKIN PROX 35W (STAPLE) IMPLANT
STRIP CLOSURE SKIN 1/2X4 (GAUZE/BANDAGES/DRESSINGS) IMPLANT
SUT MNCRL 4-0 (SUTURE) ×2
SUT MNCRL 4-0 27XMFL (SUTURE) ×1
SUT VIC AB 2-0 CT1 27 (SUTURE)
SUT VIC AB 2-0 CT1 TAPERPNT 27 (SUTURE) IMPLANT
SUT VICRYL 0 TIES 12 18 (SUTURE) IMPLANT
SUTURE MNCRL 4-0 27XMF (SUTURE) ×1 IMPLANT
SWAB CULTURE AMIES ANAERIB BLU (MISCELLANEOUS) IMPLANT
SYR BULB IRRIG 60ML STRL (SYRINGE) IMPLANT
SYRINGE 10CC LL (SYRINGE) IMPLANT
TROCAR XCEL 12X100 BLDLESS (ENDOMECHANICALS) ×3 IMPLANT
TROCAR XCEL NON-BLD 5MMX100MML (ENDOMECHANICALS) ×3 IMPLANT
TUBING INSUFFLATOR HI FLOW (MISCELLANEOUS) ×3 IMPLANT

## 2017-01-14 NOTE — Transfer of Care (Signed)
Immediate Anesthesia Transfer of Care Note  Patient: Jimmy Leach  Procedure(s) Performed: Procedure(s): LAPAROSCOPIC DRAINAGE OF PELVIC ABSCESS (N/A)  Patient Location: PACU  Anesthesia Type:General  Level of Consciousness: awake, alert  and oriented  Airway & Oxygen Therapy: Patient Spontanous Breathing and Patient connected to nasal cannula oxygen  Post-op Assessment: Report given to RN and Post -op Vital signs reviewed and stable  Post vital signs: Reviewed and stable  Last Vitals:  Vitals:   01/14/17 1201 01/14/17 1412  BP: 122/77 123/80  Pulse: 94 97  Resp: 16 20  Temp: 37.9 C 37.3 C    Last Pain:  Vitals:   01/14/17 1201  TempSrc: Tympanic  PainSc: 7       Patients Stated Pain Goal: 2 (01/13/17 2235)  Complications: No apparent anesthesia complications

## 2017-01-14 NOTE — Anesthesia Preprocedure Evaluation (Addendum)
Anesthesia Evaluation  Patient identified by MRN, date of birth, ID band Patient awake    Reviewed: Allergy & Precautions, H&P , NPO status , Patient's Chart, lab work & pertinent test results, reviewed documented beta blocker date and time   Airway Mallampati: II  TM Distance: >3 FB Neck ROM: full    Dental  (+) Chipped, Poor Dentition   Pulmonary neg shortness of breath, Current Smoker,    Pulmonary exam normal breath sounds clear to auscultation       Cardiovascular Exercise Tolerance: Good (-) angina(-) Past MI and (-) DOE negative cardio ROS Normal cardiovascular exam Rhythm:regular Rate:Normal     Neuro/Psych PSYCHIATRIC DISORDERS Anxiety negative neurological ROS     GI/Hepatic Neg liver ROS, GERD  Controlled,  Endo/Other  negative endocrine ROS  Renal/GU      Musculoskeletal   Abdominal   Peds  Hematology negative hematology ROS (+)   Anesthesia Other Findings Past Medical History: No date: ADHD  History reviewed. No pertinent surgical history.  BMI    Body Mass Index:  22.88 kg/m      Reproductive/Obstetrics negative OB ROS                            Anesthesia Physical Anesthesia Plan  ASA: III  Anesthesia Plan: General   Post-op Pain Management:    Induction: Intravenous, Rapid sequence and Cricoid pressure planned  Airway Management Planned: Oral ETT  Additional Equipment:   Intra-op Plan:   Post-operative Plan:   Informed Consent: I have reviewed the patients History and Physical, chart, labs and discussed the procedure including the risks, benefits and alternatives for the proposed anesthesia with the patient or authorized representative who has indicated his/her understanding and acceptance.   Dental Advisory Given  Plan Discussed with: CRNA  Anesthesia Plan Comments:        Anesthesia Quick Evaluation

## 2017-01-14 NOTE — Brief Op Note (Signed)
01/13/2017 - 01/14/2017  2:03 PM  PATIENT:  Jimmy Leach  21 y.o. male  PRE-OPERATIVE DIAGNOSIS:  APPENDICITIS  POST-OPERATIVE DIAGNOSIS:  APPENDICITIS  PROCEDURE:  Procedure(s): LAPAROSCOPIC DRAINAGE OF PELVIC ABSCESS (N/A)  SURGEON:  Surgeon(s) and Role:    * Ricarda Frame, MD - Primary  PHYSICIAN ASSISTANT:   ASSISTANTS: none   ANESTHESIA:   general  EBL:  Total I/O In: 567 [I.V.:549; IV Piggyback:18] Out: 660 [Urine:460; Blood:200]  BLOOD ADMINISTERED:none  DRAINS: (75fr) Blake drain(s) in the pelvis and right paracolic gutter   LOCAL MEDICATIONS USED:  MARCAINE   , XYLOCAINE  and Amount: 14 ml  SPECIMEN:  No Specimen  DISPOSITION OF SPECIMEN:  N/A  COUNTS:  YES  TOURNIQUET:  * No tourniquets in log *  DICTATION: .Dragon Dictation  PLAN OF CARE: return to inpatient status  PATIENT DISPOSITION:  PACU - hemodynamically stable.   Delay start of Pharmacological VTE agent (>24hrs) due to surgical blood loss or risk of bleeding: no

## 2017-01-14 NOTE — Op Note (Signed)
Pre-operative Diagnosis: Ruptured appendicitis  Post-operative Diagnosis: Ruptured appendicitis  Procedure performed: Laparoscopic drainage of pelvic abscess  Surgeon: Ricarda Frame   Assistants: None  Anesthesia: General endotracheal anesthesia  ASA Class: 2  Surgeon: Ricarda Frame, MD FACS  Anesthesia: Gen. with endotracheal tube  Assistant: None  Procedure Details  The patient was seen again in the Holding Room. The benefits, complications, treatment options, and expected outcomes were discussed with the patient. The risks of bleeding, infection, recurrence of symptoms, failure to resolve symptoms,  bowel injury, any of which could require further surgery were reviewed with the patient.   The patient was taken to Operating Room, identified as Jimmy Leach and the procedure verified.  A Time Out was held and the above information confirmed.  Prior to the induction of general anesthesia, antibiotic prophylaxis was administered. VTE prophylaxis was in place. General endotracheal anesthesia was then administered and tolerated well. After the induction, the abdomen was prepped with Chloraprep and draped in the sterile fashion. The patient was positioned in the supine position.  The procedure began with a left upper quadrant Veress needle approach. An area 2 finger breath below the costal margin in the midclavicular line was localized with a 50-50 mixture of 1% lidocaine and 0.5% Marcaine plain. The skin was incised with an 11 blade scalpel and using a Veress needle and pneumoperitoneum was established up to 15 mmHg. Using a 5 mm Optiview trocar entry in the peritoneal cavity was obtained. There is no evidence of damage from either the Veress needle or the Optiview trocar.  After this an infraumbilical 5 mm and a left lower quadrant 12 mm trocar were both placed under direct visualization after localization with the aforementioned local anesthetic. The patient was placed head  down and rotated to the left. Due to the extensive inflammation and into the pelvis adequate visualization was not able to be obtained with the camera in the left upper quadrant. At this point a low midline 5 mm trocar was placed under direct visualization after localization. This allowed the cecum and terminal ileum be identified. The ileum was noted to be extensively inflamed and involved with pelvic abscess. Pelvic abscess was entered into and the area was copiously irrigated until the irrigation returned clear. After extensive dissection the area that was thought to be where the appendix was was noted to be fully obliterated with the abscess cavity. The cecum and terminal ileum were clearly identified and there is no evidence of bowel lumen.  After multiple attempts the appendix was never identified and the decision was made to place [place] drains within the pelvic abscess cavity. 19 [place] Blake drains were placed into the pelvis and into the right pericolic gutter under direct visualization. They were through the low midline and the left lower quadrant incision site. There were secured to the skin with a 3-0 nylon suture. The pneumoperitoneum was then released and the remaining operative trochars were removed without any difficulty. These operative sites were closed with a 4-0 Monocryl suture and then sealed with Mastisol, Steri-Strips, Tegaderm.  The patient tolerated procedure well and was awoken from general anesthesia and the operative room. He was then transferred to the PACU in good condition. There were no immediate, complications and all counts are correct at the end of the procedure.  Findings: Large pelvic abscess   Estimated Blood Loss: 200 mL         Drains: 62 French Blake drain into the pelvis and right paracolic gutter  Specimens: None          Complications: None                  Condition: Good   Clayburn Pert, MD, FACS

## 2017-01-14 NOTE — Progress Notes (Signed)
Questioned Dr. Tonita Cong about preop amntibiotic, last dose of zosyn given IV at 05:30, will give dose of zosyn per order preop.

## 2017-01-14 NOTE — Anesthesia Post-op Follow-up Note (Cosign Needed)
Anesthesia QCDR form completed.        

## 2017-01-14 NOTE — Anesthesia Procedure Notes (Signed)
Procedure Name: Intubation Date/Time: 01/14/2017 12:35 PM Performed by: Hedda Slade Pre-anesthesia Checklist: Patient identified, Patient being monitored, Timeout performed, Emergency Drugs available and Suction available Patient Re-evaluated:Patient Re-evaluated prior to inductionOxygen Delivery Method: Circle system utilized Preoxygenation: Pre-oxygenation with 100% oxygen Intubation Type: IV induction Ventilation: Mask ventilation without difficulty Laryngoscope Size: Mac and 3 Grade View: Grade I Tube type: Oral Tube size: 7.0 mm Number of attempts: 1 Airway Equipment and Method: Stylet Placement Confirmation: ETT inserted through vocal cords under direct vision,  positive ETCO2 and breath sounds checked- equal and bilateral Secured at: 22 cm Tube secured with: Tape Dental Injury: Teeth and Oropharynx as per pre-operative assessment

## 2017-01-14 NOTE — Progress Notes (Signed)
Reported elevated temp of 101.5 to Dr. Everlene Farrier. Pt currently only has oral tylenol. Pt is currently sleeping. Nurse to offer tylenol suppository if pt wakes up and reports discomfort.

## 2017-01-15 ENCOUNTER — Encounter: Payer: Self-pay | Admitting: General Surgery

## 2017-01-15 LAB — BASIC METABOLIC PANEL
ANION GAP: 5 (ref 5–15)
BUN: 7 mg/dL (ref 6–20)
CALCIUM: 8.7 mg/dL — AB (ref 8.9–10.3)
CO2: 30 mmol/L (ref 22–32)
CREATININE: 0.84 mg/dL (ref 0.61–1.24)
Chloride: 101 mmol/L (ref 101–111)
GFR calc Af Amer: >60 mL/min (ref >60)
GLUCOSE: 178 mg/dL — AB (ref 65–99)
Potassium: 4.3 mmol/L (ref 3.5–5.1)
Sodium: 136 mmol/L (ref 135–145)

## 2017-01-15 LAB — CBC
HCT: 34.3 % — ABNORMAL LOW (ref 40.0–52.0)
Hemoglobin: 11.4 g/dL — ABNORMAL LOW (ref 13.0–18.0)
MCH: 28.9 pg (ref 26.0–34.0)
MCHC: 33.3 g/dL (ref 32.0–36.0)
MCV: 86.7 fL (ref 80.0–100.0)
PLATELETS: 342 10*3/uL (ref 150–440)
RBC: 3.95 MIL/uL — ABNORMAL LOW (ref 4.40–5.90)
RDW: 13.6 % (ref 11.5–14.5)
WBC: 17.2 10*3/uL — ABNORMAL HIGH (ref 3.8–10.6)

## 2017-01-15 LAB — HIV ANTIBODY (ROUTINE TESTING W REFLEX): HIV Screen 4th Generation wRfx: NONREACTIVE

## 2017-01-15 MED ORDER — MORPHINE SULFATE (PF) 2 MG/ML IV SOLN
2.0000 mg | INTRAVENOUS | Status: DC | PRN
  Administered 2017-01-16 (×2): 2 mg via INTRAVENOUS
  Filled 2017-01-15 (×2): qty 1

## 2017-01-15 MED ORDER — KETOROLAC TROMETHAMINE 30 MG/ML IJ SOLN
30.0000 mg | Freq: Four times a day (QID) | INTRAMUSCULAR | Status: DC
  Administered 2017-01-15 – 2017-01-16 (×3): 30 mg via INTRAVENOUS
  Filled 2017-01-15 (×3): qty 1

## 2017-01-15 NOTE — Progress Notes (Signed)
1 Day Post-Op   Subjective:  Patient reports his abdominal pain has changed from before surgery to after surgery. Tolerating a diet. Having bowel function.  Vital signs in last 24 hours: Temp:  [97.5 F (36.4 C)-99.1 F (37.3 C)] 97.5 F (36.4 C) (04/06 1250) Pulse Rate:  [60-97] 60 (04/06 1250) Resp:  [12-21] 20 (04/06 0542) BP: (112-135)/(65-86) 134/84 (04/06 1250) SpO2:  [93 %-100 %] 97 % (04/06 0542) Weight:  [67.5 kg (148 lb 12.8 oz)] 67.5 kg (148 lb 12.8 oz) (04/06 0545) Last BM Date: 01/13/17  Intake/Output from previous day: 04/05 0701 - 04/06 0700 In: 2091.7 [I.V.:2001.7; IV Piggyback:30] Out: 1495 [Urine:1135; Drains:160; Blood:200]  GI: Abdomen soft, tender to palpation at his incision sites, nondistended. JP drains in place draining a serosanguineous fluid. No evidence of purulence from any of the incision sites or spreading erythema.  Lab Results:  CBC  Recent Labs  01/14/17 0513 01/15/17 0519  WBC 22.0* 17.2*  HGB 12.0* 11.4*  HCT 35.7* 34.3*  PLT 291 342   CMP     Component Value Date/Time   NA 136 01/15/2017 0519   K 4.3 01/15/2017 0519   CL 101 01/15/2017 0519   CO2 30 01/15/2017 0519   GLUCOSE 178 (H) 01/15/2017 0519   BUN 7 01/15/2017 0519   CREATININE 0.84 01/15/2017 0519   CALCIUM 8.7 (L) 01/15/2017 0519   PROT 6.5 01/14/2017 0513   ALBUMIN 2.7 (L) 01/14/2017 0513   AST 17 01/14/2017 0513   ALT 16 (L) 01/14/2017 0513   ALKPHOS 80 01/14/2017 0513   BILITOT 1.6 (H) 01/14/2017 0513   GFRNONAA >60 01/15/2017 0519   GFRAA >60 01/15/2017 0519   PT/INR No results for input(s): LABPROT, INR in the last 72 hours.  Studies/Results: Ct Abdomen Pelvis W Contrast  Result Date: 01/13/2017 CLINICAL DATA:  Generalized abdominal pain for 3 weeks worse in morning, has been having nausea, vomiting and diarrhea EXAM: CT ABDOMEN AND PELVIS WITH CONTRAST TECHNIQUE: Multidetector CT imaging of the abdomen and pelvis was performed using the standard protocol  following bolus administration of intravenous contrast. Sagittal and coronal MPR images reconstructed from axial data set. CONTRAST:  ISOVUE-300 IOPAMIDOL (ISOVUE-300) INJECTION 61% COMPARISON:  Dilute oral contrast.  100 cc Isovue-300 IV. FINDINGS: Lower chest: None Hepatobiliary: Lung bases clear Pancreas: Gallbladder and liver normal appearance Spleen: Normal appearance Adrenals/Urinary Tract: Normal appearance with probable splenule medial to spleen Stomach/Bowel: Adrenal glands, kidneys, and ureters normal appearance. Bladder decompressed, wall prominent question artifact. Vascular/Lymphatic: Focal inflammatory process identified in the RIGHT pelvis adjacent to the tip of the cecum. Potential appendiculolith is noted in the RIGHT pelvis image 56 though. Significant wall thickening of the cecal tip and terminal ileum. Large associated gas and fluid collection consistent with abscess 7.0 x 4.3 x 6.8 cm. Surrounding inflammatory changes and adjacent reactive adenopathy. Findings are most suspicious for perforated appendicitis with abscess formation, less likely a primary small bowel process though not completely excluded. Reproductive: No evidence of bowel obstruction. Stomach and remaining bowel loops unremarkable. Other: Small amount of free fluid in pelvis. No free air. No hernia. Musculoskeletal: Unremarkable IMPRESSION: Large inflammatory process identified in the RIGHT pelvis adjacent to the cecum and terminal ileum most likely representing perforated appendicitis with secondary inflammatory changes and a large periappendiceal abscess 7.0 x 4.3 x 6.8 cm. Findings called to Dr. Derrill Kay on 01/13/2017 at 1745 hours. Electronically Signed   By: Ulyses Southward M.D.   On: 01/13/2017 17:47  Assessment/Plan: 21 year old male postop day 1 from a lapa71roscopic drainage of pelvic abscess. Discussed with the patient that he will remain on IV antibiotics until his white blood cell count normalizes. He will then  transitioned to oral antibiotics for a prolonged course. Encourage ambulation, incentive spirometer usage.   Ricarda Frame, MD [facility] General Surgeon [facility]  Day [facility] [phone] Night [facility] [phone]  01/15/2017

## 2017-01-15 NOTE — Anesthesia Postprocedure Evaluation (Signed)
Anesthesia Post Note  Patient: Jimmy Leach  Procedure(s) Performed: Procedure(s) (LRB): LAPAROSCOPIC DRAINAGE OF PELVIC ABSCESS (N/A)  Patient location during evaluation: PACU Anesthesia Type: General Level of consciousness: awake and alert Pain management: pain level controlled Vital Signs Assessment: post-procedure vital signs reviewed and stable Respiratory status: spontaneous breathing, nonlabored ventilation, respiratory function stable and patient connected to nasal cannula oxygen Cardiovascular status: blood pressure returned to baseline and stable Postop Assessment: no signs of nausea or vomiting Anesthetic complications: no     Last Vitals:  Vitals:   01/15/17 0020 01/15/17 0542  BP: 135/73 112/65  Pulse: 78 63  Resp: 20 20  Temp: 36.6 C 36.7 C    Last Pain:  Vitals:   01/15/17 0542  TempSrc: Oral  PainSc:                  [name]

## 2017-01-15 NOTE — Progress Notes (Signed)
Pt c/o blurry vision. VSS. Pupils reactive. MD aware.

## 2017-01-16 LAB — BASIC METABOLIC PANEL
ANION GAP: 5 (ref 5–15)
BUN: 7 mg/dL (ref 6–20)
CALCIUM: 8.1 mg/dL — AB (ref 8.9–10.3)
CO2: 28 mmol/L (ref 22–32)
Chloride: 107 mmol/L (ref 101–111)
Creatinine, Ser: 0.82 mg/dL (ref 0.61–1.24)
GFR calc Af Amer: >60 mL/min (ref >60)
GFR calc non Af Amer: >60 mL/min (ref >60)
GLUCOSE: 142 mg/dL — AB (ref 65–99)
Potassium: 3.3 mmol/L — ABNORMAL LOW (ref 3.5–5.1)
Sodium: 140 mmol/L (ref 135–145)

## 2017-01-16 LAB — CBC
HCT: 31.3 % — ABNORMAL LOW (ref 40.0–52.0)
Hemoglobin: 10.6 g/dL — ABNORMAL LOW (ref 13.0–18.0)
MCH: 29.1 pg (ref 26.0–34.0)
MCHC: 33.9 g/dL (ref 32.0–36.0)
MCV: 86.1 fL (ref 80.0–100.0)
Platelets: 341 10*3/uL (ref 150–440)
RBC: 3.63 MIL/uL — ABNORMAL LOW (ref 4.40–5.90)
RDW: 13.4 % (ref 11.5–14.5)
WBC: 13.9 10*3/uL — AB (ref 3.8–10.6)

## 2017-01-16 MED ORDER — POTASSIUM CHLORIDE CRYS ER 20 MEQ PO TBCR
40.0000 meq | EXTENDED_RELEASE_TABLET | Freq: Once | ORAL | Status: AC
  Administered 2017-01-16: 40 meq via ORAL
  Filled 2017-01-16: qty 2

## 2017-01-16 MED ORDER — KETOROLAC TROMETHAMINE 30 MG/ML IJ SOLN
30.0000 mg | Freq: Three times a day (TID) | INTRAMUSCULAR | Status: DC
  Administered 2017-01-16 – 2017-01-17 (×3): 30 mg via INTRAVENOUS
  Filled 2017-01-16 (×3): qty 1

## 2017-01-16 NOTE — Progress Notes (Signed)
AVSS. Still with significant pain, although he [place]. + BM. Lungs: Clear. ABD: Good BS, non-distended. Drains: volume decreasing. NO purulence. WBC trending down. Will hold diet advance today.

## 2017-01-17 LAB — CBC
HEMATOCRIT: 34.3 % — AB (ref 40.0–52.0)
Hemoglobin: 11.4 g/dL — ABNORMAL LOW (ref 13.0–18.0)
MCH: 28.8 pg (ref 26.0–34.0)
MCHC: 33.2 g/dL (ref 32.0–36.0)
MCV: 86.5 fL (ref 80.0–100.0)
PLATELETS: 408 10*3/uL (ref 150–440)
RBC: 3.97 MIL/uL — ABNORMAL LOW (ref 4.40–5.90)
RDW: 13.5 % (ref 11.5–14.5)
WBC: 12.5 10*3/uL — AB (ref 3.8–10.6)

## 2017-01-17 LAB — BASIC METABOLIC PANEL
ANION GAP: 7 (ref 5–15)
CO2: 27 mmol/L (ref 22–32)
Calcium: 8.5 mg/dL — ABNORMAL LOW (ref 8.9–10.3)
Chloride: 102 mmol/L (ref 101–111)
Creatinine, Ser: 0.68 mg/dL (ref 0.61–1.24)
GFR calc Af Amer: >60 mL/min (ref >60)
GFR calc non Af Amer: >60 mL/min (ref >60)
GLUCOSE: 104 mg/dL — AB (ref 65–99)
Potassium: 3.5 mmol/L (ref 3.5–5.1)
Sodium: 136 mmol/L (ref 135–145)

## 2017-01-17 MED ORDER — MORPHINE SULFATE (PF) 2 MG/ML IV SOLN
1.0000 mg | INTRAVENOUS | Status: DC | PRN
  Administered 2017-01-17 – 2017-01-19 (×2): 1 mg via INTRAVENOUS
  Filled 2017-01-17 (×3): qty 1

## 2017-01-17 MED ORDER — KCL IN DEXTROSE-NACL 40-5-0.9 MEQ/L-%-% IV SOLN
INTRAVENOUS | Status: DC
  Administered 2017-01-17 – 2017-01-19 (×5): via INTRAVENOUS
  Filled 2017-01-17 (×7): qty 1000

## 2017-01-17 MED ORDER — ONDANSETRON HCL 4 MG/2ML IJ SOLN
4.0000 mg | Freq: Four times a day (QID) | INTRAMUSCULAR | Status: DC | PRN
  Administered 2017-01-17: 4 mg via INTRAVENOUS
  Filled 2017-01-17 (×2): qty 2

## 2017-01-17 MED ORDER — KETOROLAC TROMETHAMINE 15 MG/ML IJ SOLN
15.0000 mg | Freq: Three times a day (TID) | INTRAMUSCULAR | Status: AC
  Administered 2017-01-17 – 2017-01-18 (×3): 15 mg via INTRAVENOUS
  Filled 2017-01-17 (×3): qty 1

## 2017-01-17 MED ORDER — POTASSIUM CHLORIDE CRYS ER 20 MEQ PO TBCR
40.0000 meq | EXTENDED_RELEASE_TABLET | Freq: Once | ORAL | Status: AC
  Administered 2017-01-17: 40 meq via ORAL
  Filled 2017-01-17: qty 2

## 2017-01-17 MED ORDER — ONDANSETRON HCL 4 MG/2ML IJ SOLN
4.0000 mg | Freq: Four times a day (QID) | INTRAMUSCULAR | Status: DC | PRN
  Administered 2017-01-17 – 2017-01-19 (×3): 4 mg via INTRAVENOUS
  Filled 2017-01-17 (×2): qty 2

## 2017-01-17 NOTE — Progress Notes (Signed)
AVSS. Still troubled by nausea.  Reports he did not ambulate yesterday as he was too sedated from medications. Lungs: Clear. ABD: Non-distended. No focal tenderness, although he reports RLQ discomfort on awakening. Labs: WBC  Slowly trending down. Drains: 130/80 respectively yesterday.

## 2017-01-18 LAB — BASIC METABOLIC PANEL
ANION GAP: 6 (ref 5–15)
BUN: 6 mg/dL (ref 6–20)
CHLORIDE: 105 mmol/L (ref 101–111)
CO2: 26 mmol/L (ref 22–32)
CREATININE: 0.83 mg/dL (ref 0.61–1.24)
Calcium: 8.7 mg/dL — ABNORMAL LOW (ref 8.9–10.3)
GFR calc non Af Amer: >60 mL/min (ref >60)
Glucose, Bld: 105 mg/dL — ABNORMAL HIGH (ref 65–99)
POTASSIUM: 4.1 mmol/L (ref 3.5–5.1)
SODIUM: 137 mmol/L (ref 135–145)

## 2017-01-18 LAB — CBC
HEMATOCRIT: 34.7 % — AB (ref 40.0–52.0)
HEMOGLOBIN: 11.6 g/dL — AB (ref 13.0–18.0)
MCH: 29 pg (ref 26.0–34.0)
MCHC: 33.4 g/dL (ref 32.0–36.0)
MCV: 86.9 fL (ref 80.0–100.0)
Platelets: 407 10*3/uL (ref 150–440)
RBC: 3.99 MIL/uL — ABNORMAL LOW (ref 4.40–5.90)
RDW: 13.5 % (ref 11.5–14.5)
WBC: 11.2 10*3/uL — ABNORMAL HIGH (ref 3.8–10.6)

## 2017-01-18 NOTE — Progress Notes (Signed)
  Visit with patient this evening. Patient eating his dinner without difficulty. Patient reports his pain in his nausea much better than previously today.  Counseled the patient about ambulation and incentive spirometer usage. Also discussed timing of drain removal, conversion oral antibiotics, possible interval CT scans and whether not an interval appendectomy would be indicated.  All questions answered and patient and the patient's family's satisfaction.  Ricarda Frame, MD [facility] General Surgeon [facility]  Day [facility] [phone] Night [facility] [phone]

## 2017-01-18 NOTE — Progress Notes (Signed)
4 Days Post-Op  Subjective: Complains of nausea and nearly vomited but did not in my presence this morning. He is not taking much by mouth's. He is having bowel movements  Objective: Vital signs in last 24 hours: Temp:  [97.5 F (36.4 C)-98.4 F (36.9 C)] 98.1 F (36.7 C) (04/09 0449) Pulse Rate:  [58-75] 58 (04/09 0449) Resp:  [18] 18 (04/09 0449) BP: (116-126)/(75-80) 116/75 (04/09 0449) SpO2:  [97 %-100 %] 97 % (04/09 0449) Last BM Date: 01/16/17  Intake/Output from previous day: 04/08 0701 - 04/09 0700 In: 1768.8 [P.O.:240; I.V.:1428.8; IV Piggyback:100] Out: 760 [Urine:700; Drains:60] Intake/Output this shift: No intake/output data recorded.  Physical exam:  Vital signs reviewed and stable abdomen is soft nondistended nontympanitic minimally tender drains in place with no purulence but serous fluid. Calves are nontender  Lab Results: CBC   Recent Labs  01/17/17 0427 01/18/17 0313  WBC 12.5* 11.2*  HGB 11.4* 11.6*  HCT 34.3* 34.7*  PLT 408 407   BMET  Recent Labs  01/17/17 0427 01/18/17 0313  NA 136 137  K 3.5 4.1  CL 102 105  CO2 27 26  GLUCOSE 104* 105*  BUN <5* 6  CREATININE 0.68 0.83  CALCIUM 8.5* 8.7*   PT/INR No results for input(s): LABPROT, INR in the last 72 hours. ABG No results for input(s): PHART, HCO3 in the last 72 hours.  Invalid input(s): PCO2, PO2  Studies/Results: No results found.  Anti-infectives: Anti-infectives    Start     Dose/Rate Route Frequency Ordered Stop   01/14/17 1700  piperacillin-tazobactam (ZOSYN) IVPB 3.375 g     3.375 g 12.5 mL/hr over 240 Minutes Intravenous Every 8 hours 01/14/17 1654     01/14/17 1215  piperacillin-tazobactam (ZOSYN) IVPB 3.375 g     3.375 g 100 mL/hr over 30 Minutes Intravenous  Once 01/14/17 1208 01/14/17 1254   01/13/17 2300  piperacillin-tazobactam (ZOSYN) 3.375 g in dextrose 5 % 50 mL IVPB  Status:  Discontinued     3.375 g 12.5 mL/hr over 240 Minutes Intravenous Every 8 hours  01/13/17 2240 01/14/17 1654   01/13/17 2230  piperacillin-tazobactam (ZOSYN) IVPB 3.375 g  Status:  Discontinued     3.375 g 12.5 mL/hr over 240 Minutes Intravenous Every 8 hours 01/13/17 2228 01/13/17 2240   01/13/17 1815  piperacillin-tazobactam (ZOSYN) IVPB 3.375 g     3.375 g 100 mL/hr over 30 Minutes Intravenous  Once 01/13/17 1807 01/13/17 1911      Assessment/Plan: s/p Procedure(s): LAPAROSCOPIC DRAINAGE OF PELVIC ABSCESS   White blood cell count improved today but patient remains nauseated and has had some near emesis. Recommend continuing IV antibiotics for now and maintaining full liquid diet until he is able to take more by mouth.  Lattie Haw, MD, FACS  01/18/2017

## 2017-01-19 LAB — CBC WITH DIFFERENTIAL/PLATELET
Basophils Absolute: 0.2 10*3/uL — ABNORMAL HIGH (ref 0–0.1)
Basophils Relative: 1 %
Eosinophils Absolute: 0.4 10*3/uL (ref 0–0.7)
Eosinophils Relative: 3 %
HCT: 35.4 % — ABNORMAL LOW (ref 40.0–52.0)
Hemoglobin: 11.9 g/dL — ABNORMAL LOW (ref 13.0–18.0)
LYMPHS ABS: 2.3 10*3/uL (ref 1.0–3.6)
Lymphocytes Relative: 18 %
MCH: 29 pg (ref 26.0–34.0)
MCHC: 33.8 g/dL (ref 32.0–36.0)
MCV: 85.8 fL (ref 80.0–100.0)
MONO ABS: 0.8 10*3/uL (ref 0.2–1.0)
MONOS PCT: 6 %
NEUTROS PCT: 72 %
Neutro Abs: 9.2 10*3/uL — ABNORMAL HIGH (ref 1.4–6.5)
Platelets: 475 10*3/uL — ABNORMAL HIGH (ref 150–440)
RBC: 4.12 MIL/uL — ABNORMAL LOW (ref 4.40–5.90)
RDW: 13.3 % (ref 11.5–14.5)
WBC: 12.9 10*3/uL — AB (ref 3.8–10.6)

## 2017-01-19 NOTE — Progress Notes (Signed)
5 Days Post-Op  Subjective: Patient feels better today no nausea E tolerated his diet last night he still has ongoing pain.  Objective: Vital signs in last 24 hours: Temp:  [97.9 F (36.6 C)-98.3 F (36.8 C)] 98.3 F (36.8 C) (04/10 0545) Pulse Rate:  [63-65] 63 (04/10 0545) Resp:  [16-18] 18 (04/10 0545) BP: (119-130)/(70-80) 119/70 (04/10 0545) SpO2:  [98 %-99 %] 99 % (04/10 0545) Last BM Date: 01/17/17  Intake/Output from previous day: 04/09 0701 - 04/10 0700 In: 2537 [P.O.:560; I.V.:1873; IV Piggyback:104] Out: 657 [Urine:600; Drains:57] Intake/Output this shift: No intake/output data recorded.  Physical exam:  Drains in place no purulence soft nontender abdomen drains putting out serous fluid only calves are nontender afebrile  Lab Results: CBC   Recent Labs  01/18/17 0313 01/19/17 0545  WBC 11.2* 12.9*  HGB 11.6* 11.9*  HCT 34.7* 35.4*  PLT 407 475*   BMET  Recent Labs  01/17/17 0427 01/18/17 0313  NA 136 137  K 3.5 4.1  CL 102 105  CO2 27 26  GLUCOSE 104* 105*  BUN <5* 6  CREATININE 0.68 0.83  CALCIUM 8.5* 8.7*   PT/INR No results for input(s): LABPROT, INR in the last 72 hours. ABG No results for input(s): PHART, HCO3 in the last 72 hours.  Invalid input(s): PCO2, PO2  Studies/Results: No results found.  Anti-infectives: Anti-infectives    Start     Dose/Rate Route Frequency Ordered Stop   01/14/17 1700  piperacillin-tazobactam (ZOSYN) IVPB 3.375 g     3.375 g 12.5 mL/hr over 240 Minutes Intravenous Every 8 hours 01/14/17 1654     01/14/17 1215  piperacillin-tazobactam (ZOSYN) IVPB 3.375 g     3.375 g 100 mL/hr over 30 Minutes Intravenous  Once 01/14/17 1208 01/14/17 1254   01/13/17 2300  piperacillin-tazobactam (ZOSYN) 3.375 g in dextrose 5 % 50 mL IVPB  Status:  Discontinued     3.375 g 12.5 mL/hr over 240 Minutes Intravenous Every 8 hours 01/13/17 2240 01/14/17 1654   01/13/17 2230  piperacillin-tazobactam (ZOSYN) IVPB 3.375 g   Status:  Discontinued     3.375 g 12.5 mL/hr over 240 Minutes Intravenous Every 8 hours 01/13/17 2228 01/13/17 2240   01/13/17 1815  piperacillin-tazobactam (ZOSYN) IVPB 3.375 g     3.375 g 100 mL/hr over 30 Minutes Intravenous  Once 01/13/17 1807 01/13/17 1911      Assessment/Plan: s/p Procedure(s): LAPAROSCOPIC DRAINAGE OF PELVIC ABSCESS   White blood cell count slightly elevated today over yesterday we'll recommend continuing IV antibiotics for now but advancing diet.  Lattie Haw, MD, FACS  01/19/2017

## 2017-01-20 LAB — CBC WITH DIFFERENTIAL/PLATELET
Basophils Absolute: 0.1 10*3/uL (ref 0–0.1)
Basophils Relative: 1 %
Eosinophils Absolute: 0.4 10*3/uL (ref 0–0.7)
Eosinophils Relative: 3 %
HEMATOCRIT: 36.2 % — AB (ref 40.0–52.0)
HEMOGLOBIN: 12.4 g/dL — AB (ref 13.0–18.0)
LYMPHS ABS: 2.5 10*3/uL (ref 1.0–3.6)
LYMPHS PCT: 20 %
MCH: 29.6 pg (ref 26.0–34.0)
MCHC: 34.2 g/dL (ref 32.0–36.0)
MCV: 86.6 fL (ref 80.0–100.0)
MONO ABS: 0.8 10*3/uL (ref 0.2–1.0)
MONOS PCT: 6 %
NEUTROS ABS: 8.6 10*3/uL — AB (ref 1.4–6.5)
NEUTROS PCT: 70 %
Platelets: 496 10*3/uL — ABNORMAL HIGH (ref 150–440)
RBC: 4.17 MIL/uL — ABNORMAL LOW (ref 4.40–5.90)
RDW: 13.2 % (ref 11.5–14.5)
WBC: 12.4 10*3/uL — ABNORMAL HIGH (ref 3.8–10.6)

## 2017-01-20 LAB — CREATININE, SERUM
Creatinine, Ser: 1.11 mg/dL (ref 0.61–1.24)
GFR calc Af Amer: >60 mL/min (ref >60)

## 2017-01-20 MED ORDER — AMOXICILLIN-POT CLAVULANATE 875-125 MG PO TABS: 1.0000 | ORAL_TABLET | Freq: Two times a day (BID) | ORAL | 1 refills | Status: DC

## 2017-01-20 MED ORDER — HYDROCODONE-ACETAMINOPHEN 5-325 MG PO TABS: 1.0000 | ORAL_TABLET | ORAL | 0 refills | Status: DC | PRN

## 2017-01-20 NOTE — Progress Notes (Signed)
Discharge teaching given to pt. Appointment follow up reviewed with teach back. Prescription given to pt. Open clinic application given to pt. Along with discount card for amoxicillin. IV removed x1 without difficulty. Discharge via private vehicle. JP drains intact. Drain care given

## 2017-01-20 NOTE — Discharge Summary (Signed)
Physician Discharge Summary  Patient ID: EMERSON SCHREIFELS MRN: [id number] DOB/AGE: Aug 04, 1996 21 y.o.  Admit date: 01/13/2017 Discharge date: 01/20/2017   Discharge Diagnoses:  Active Problems:   Ruptured appendicitis   Procedures:Laparoscopic appendectomy  Hospital Course: This patient admitted the hospital with signs of a ruptured appendix a window laparoscopic appendectomy. Postoperatively he was placed on IV antibiotics and progressed in a positive manner he is tolerating a regular diet and feels much better today. He will be discharged on oral Augmentin and oral analgesics to follow up in our office in 10 days he will also be instructed about drain care for drain removal next week.  Consults: None  Disposition: Final discharge disposition not confirmed   Allergies as of 01/20/2017   No Known Allergies     Medication List    TAKE these medications   amoxicillin-clavulanate 875-125 MG tablet Commonly known as:  AUGMENTIN Take 1 tablet by mouth 2 (two) times daily.   HYDROcodone-acetaminophen 5-325 MG tablet Commonly known as:  NORCO/VICODIN Take 1-2 tablets by mouth every 4 (four) hours as needed for moderate pain or severe pain.   ibuprofen 800 MG tablet Commonly known as:  ADVIL,MOTRIN Take 800 mg by mouth every 8 (eight) hours as needed.        Lattie Haw, MD, FACS

## 2017-01-20 NOTE — Progress Notes (Signed)
Patient feels much better today no nausea vomiting tolerating a regular diet much less pain  Vital signs are stable afebrile  Wounds are clean no erythema no drainage the JP drains are in place with serous fluid only  We'll discharge with drains in place to be removed next week will send home on oral antibiotics and analgesics

## 2017-01-20 NOTE — Discharge Instructions (Signed)
Routine drain care empty daily and record output May shower Regular diet Take oral antibiotics and oral analgesics as prescribed. Follow-up with [facility] next week for drain removal

## 2017-01-20 NOTE — Care Management (Signed)
Patient to discharge home today.  Patient admitted for ruptured lap appy. Patient to discharge on pain medication and augmentin.  Patient provided [email or web address] for augmentin.  Out of pocket cost $25.53.  Patient denies any issues obtaining medication.  Patient provided application for [facility] and Medication Management. Patient states "there is no need for me to have a doctor, unless I need to be healed".  RNCM signing off

## 2017-01-24 ENCOUNTER — Emergency Department: Payer: Self-pay

## 2017-01-24 ENCOUNTER — Encounter: Payer: Self-pay | Admitting: Radiology

## 2017-01-24 ENCOUNTER — Inpatient Hospital Stay
Admission: EM | Admit: 2017-01-24 | Discharge: 2017-01-29 | DRG: 863 | Disposition: A | Discharge status: Home / Self Care | Payer: Self-pay | Attending: Surgery | Admitting: Surgery

## 2017-01-24 DIAGNOSIS — D72829 Elevated white blood cell count, unspecified: Secondary | ICD-10-CM

## 2017-01-24 DIAGNOSIS — K567 Ileus, unspecified: Secondary | ICD-10-CM | POA: Diagnosis present

## 2017-01-24 DIAGNOSIS — K6811 Postprocedural retroperitoneal abscess: Principal | ICD-10-CM | POA: Diagnosis present

## 2017-01-24 DIAGNOSIS — T8143XA Infection following a procedure, organ and space surgical site, initial encounter: Secondary | ICD-10-CM

## 2017-01-24 DIAGNOSIS — B852 Pediculosis, unspecified: Secondary | ICD-10-CM | POA: Diagnosis present

## 2017-01-24 DIAGNOSIS — B9620 Unspecified Escherichia coli [E. coli] as the cause of diseases classified elsewhere: Secondary | ICD-10-CM | POA: Diagnosis present

## 2017-01-24 DIAGNOSIS — Z1612 Extended spectrum beta lactamase (ESBL) resistance: Secondary | ICD-10-CM | POA: Diagnosis present

## 2017-01-24 DIAGNOSIS — T8149XA Infection following a procedure, other surgical site, initial encounter: Secondary | ICD-10-CM

## 2017-01-24 DIAGNOSIS — R1084 Generalized abdominal pain: Secondary | ICD-10-CM

## 2017-01-24 LAB — URINALYSIS, COMPLETE (UACMP) WITH MICROSCOPIC
BACTERIA UA: NONE SEEN
BILIRUBIN URINE: NEGATIVE
Glucose, UA: NEGATIVE mg/dL
Hgb urine dipstick: NEGATIVE
KETONES UR: NEGATIVE mg/dL
Leukocytes, UA: NEGATIVE
Nitrite: NEGATIVE
PROTEIN: NEGATIVE mg/dL
SPECIFIC GRAVITY, URINE: 1.02 (ref 1.005–1.030)
SQUAMOUS EPITHELIAL / LPF: NONE SEEN
pH: 6 (ref 5.0–8.0)

## 2017-01-24 LAB — COMPREHENSIVE METABOLIC PANEL
ALBUMIN: 3.6 g/dL (ref 3.5–5.0)
ALK PHOS: 80 U/L (ref 38–126)
ALT: 25 U/L (ref 17–63)
AST: 24 U/L (ref 15–41)
Anion gap: 9 (ref 5–15)
BUN: 18 mg/dL (ref 6–20)
CHLORIDE: 103 mmol/L (ref 101–111)
CO2: 25 mmol/L (ref 22–32)
CREATININE: 1.04 mg/dL (ref 0.61–1.24)
Calcium: 9.5 mg/dL (ref 8.9–10.3)
GFR calc non Af Amer: >60 mL/min (ref >60)
GLUCOSE: 139 mg/dL — AB (ref 65–99)
Potassium: 3.6 mmol/L (ref 3.5–5.1)
SODIUM: 137 mmol/L (ref 135–145)
Total Bilirubin: 0.9 mg/dL (ref 0.3–1.2)
Total Protein: 8.2 g/dL — ABNORMAL HIGH (ref 6.5–8.1)

## 2017-01-24 LAB — CBC WITH DIFFERENTIAL/PLATELET
Basophils Absolute: 0.2 10*3/uL — ABNORMAL HIGH (ref 0–0.1)
Basophils Relative: 1 %
EOS ABS: 0.2 10*3/uL (ref 0–0.7)
Eosinophils Relative: 1 %
HEMATOCRIT: 40.1 % (ref 40.0–52.0)
Hemoglobin: 13.5 g/dL (ref 13.0–18.0)
LYMPHS ABS: 2.6 10*3/uL (ref 1.0–3.6)
Lymphocytes Relative: 8 %
MCH: 28.7 pg (ref 26.0–34.0)
MCHC: 33.5 g/dL (ref 32.0–36.0)
MCV: 85.7 fL (ref 80.0–100.0)
MONO ABS: 1.2 10*3/uL — AB (ref 0.2–1.0)
MONOS PCT: 4 %
NEUTROS PCT: 86 %
Neutro Abs: 27.4 10*3/uL — ABNORMAL HIGH (ref 1.4–6.5)
Platelets: 701 10*3/uL — ABNORMAL HIGH (ref 150–440)
RBC: 4.69 MIL/uL (ref 4.40–5.90)
RDW: 13.4 % (ref 11.5–14.5)
WBC: 31.5 10*3/uL — ABNORMAL HIGH (ref 3.8–10.6)

## 2017-01-24 MED ORDER — HYDROCODONE-ACETAMINOPHEN 5-325 MG PO TABS
1.0000 | ORAL_TABLET | ORAL | Status: DC | PRN
  Administered 2017-01-24 – 2017-01-29 (×2): 2 via ORAL
  Filled 2017-01-24 (×2): qty 2

## 2017-01-24 MED ORDER — PIPERACILLIN-TAZOBACTAM 3.375 G IVPB 30 MIN
3.3750 g | Freq: Once | INTRAVENOUS | Status: AC
  Administered 2017-01-24: 3.375 g via INTRAVENOUS
  Filled 2017-01-24: qty 50

## 2017-01-24 MED ORDER — METRONIDAZOLE IN NACL 5-0.79 MG/ML-% IV SOLN
INTRAVENOUS | Status: AC
  Administered 2017-01-24: 500 mg via INTRAVENOUS
  Filled 2017-01-24: qty 100

## 2017-01-24 MED ORDER — ONDANSETRON HCL 4 MG PO TABS: 4.0000 mg | ORAL_TABLET | Freq: Four times a day (QID) | ORAL | Status: DC | PRN

## 2017-01-24 MED ORDER — MELATONIN 5 MG PO TABS
5.0000 mg | ORAL_TABLET | Freq: Every day | ORAL | Status: DC
  Administered 2017-01-25 – 2017-01-28 (×5): 5 mg via ORAL
  Filled 2017-01-24 (×5): qty 1

## 2017-01-24 MED ORDER — CIPROFLOXACIN IN D5W 400 MG/200ML IV SOLN
INTRAVENOUS | Status: AC
  Filled 2017-01-24: qty 200

## 2017-01-24 MED ORDER — MORPHINE SULFATE (PF) 2 MG/ML IV SOLN
INTRAVENOUS | Status: AC
  Administered 2017-01-24: 2 mg via INTRAVENOUS
  Filled 2017-01-24: qty 1

## 2017-01-24 MED ORDER — ONDANSETRON HCL 4 MG/2ML IJ SOLN
4.0000 mg | Freq: Four times a day (QID) | INTRAMUSCULAR | Status: DC | PRN
  Administered 2017-01-24 – 2017-01-28 (×5): 4 mg via INTRAVENOUS
  Filled 2017-01-24 (×4): qty 2

## 2017-01-24 MED ORDER — CIPROFLOXACIN IN D5W 400 MG/200ML IV SOLN
400.0000 mg | Freq: Two times a day (BID) | INTRAVENOUS | Status: DC
  Administered 2017-01-24 – 2017-01-27 (×6): 400 mg via INTRAVENOUS
  Filled 2017-01-24 (×8): qty 200

## 2017-01-24 MED ORDER — ONDANSETRON HCL 4 MG/2ML IJ SOLN
4.0000 mg | Freq: Once | INTRAMUSCULAR | Status: AC
  Administered 2017-01-24: 4 mg via INTRAVENOUS
  Filled 2017-01-24: qty 2

## 2017-01-24 MED ORDER — LACTATED RINGERS IV SOLN
INTRAVENOUS | Status: DC
Start: 2017-01-24 — End: 2017-01-26
  Administered 2017-01-24 – 2017-01-26 (×5): via INTRAVENOUS

## 2017-01-24 MED ORDER — METRONIDAZOLE IN NACL 5-0.79 MG/ML-% IV SOLN
500.0000 mg | Freq: Three times a day (TID) | INTRAVENOUS | Status: DC
  Administered 2017-01-24 – 2017-01-27 (×9): 500 mg via INTRAVENOUS
  Filled 2017-01-24 (×10): qty 100

## 2017-01-24 MED ORDER — SODIUM CHLORIDE 0.9 % IV BOLUS (SEPSIS)
1000.0000 mL | Freq: Once | INTRAVENOUS | Status: AC
  Administered 2017-01-24: 1000 mL via INTRAVENOUS

## 2017-01-24 MED ORDER — ONDANSETRON HCL 4 MG/2ML IJ SOLN
INTRAMUSCULAR | Status: AC
  Filled 2017-01-24: qty 2

## 2017-01-24 MED ORDER — ONDANSETRON HCL 4 MG/2ML IJ SOLN
4.0000 mg | Freq: Once | INTRAMUSCULAR | Status: AC | PRN
  Administered 2017-01-24: 4 mg via INTRAVENOUS

## 2017-01-24 MED ORDER — HEPARIN SODIUM (PORCINE) 5000 UNIT/ML IJ SOLN
5000.0000 [IU] | Freq: Three times a day (TID) | INTRAMUSCULAR | Status: DC
  Administered 2017-01-24 – 2017-01-25 (×2): 5000 [IU] via SUBCUTANEOUS
  Filled 2017-01-24 (×2): qty 1

## 2017-01-24 MED ORDER — IOPAMIDOL (ISOVUE-300) INJECTION 61%
100.0000 mL | Freq: Once | INTRAVENOUS | Status: AC | PRN
  Administered 2017-01-24: 100 mL via INTRAVENOUS

## 2017-01-24 MED ORDER — IOPAMIDOL (ISOVUE-300) INJECTION 61%
30.0000 mL | Freq: Once | INTRAVENOUS | Status: AC | PRN
  Administered 2017-01-24: 30 mL via ORAL

## 2017-01-24 MED ORDER — MORPHINE SULFATE (PF) 2 MG/ML IV SOLN
2.0000 mg | INTRAVENOUS | Status: DC | PRN
  Administered 2017-01-24 – 2017-01-26 (×9): 2 mg via INTRAVENOUS
  Filled 2017-01-24 (×8): qty 1

## 2017-01-24 MED ORDER — MORPHINE SULFATE (PF) 2 MG/ML IV SOLN
2.0000 mg | Freq: Once | INTRAVENOUS | Status: AC
  Administered 2017-01-24: 2 mg via INTRAVENOUS

## 2017-01-24 MED ORDER — MORPHINE SULFATE (PF) 4 MG/ML IV SOLN
4.0000 mg | Freq: Once | INTRAVENOUS | Status: AC
  Administered 2017-01-24: 4 mg via INTRAVENOUS
  Filled 2017-01-24: qty 1

## 2017-01-24 NOTE — ED Provider Notes (Signed)
Patient received in sign-out from Dr. Scotty Court.  Workup and evaluation pending CT imaging.  CT imaging with evidence of right pelvic abscess status post perforated appendicitis. Case has been discussed with Dr. Excell Seltzer of general surgery who has agreed to admit patient for additional IV antibiotics, IV fluids as well as probable CT-guided drainage.  Have discussed with the patient and available family all diagnostics and treatments performed thus far and all questions were answered to the best of my ability. The patient demonstrates understanding and agreement with plan.       Willy Eddy, MD 01/24/17 [phone]

## 2017-01-24 NOTE — Progress Notes (Signed)
Spoke with Dr. Everlene Farrierbon about patient request for melatonin to sleep. New order given for melatonin at bedtime. Also spoke with Pharmacy for dosing recommendations. Anselm Jungling

## 2017-01-24 NOTE — ED Triage Notes (Signed)
C/o abdominal pain after sleeping on stomach this morning. Vomiting x 2 in lobby. S/p appendectomy with drain placement. States drainage is warm to the touch. Others in household also vomiting.

## 2017-01-24 NOTE — ED Provider Notes (Signed)
[facility] Emergency Department Provider Note  ____________________________________________  Time seen: Approximately 3:26 PM  I have reviewed the triage vital signs and the nursing notes.   HISTORY  Chief Complaint Post-op Problem    HPI Jimmy Leach is a 21 y.o. male who complains of generalized abdominal pain worse in the lower abdomen. He recently had surgery due to ruptured appendicitis and intra-abdominal abscess. He has 2 JP drains in placed. Overnight his abdominal pain started worsening again. He has nausea but no vomiting. No fevers or chills. Hurts to move or change position.   Reports multiple sick contacts with viral gastritis type symptoms.  Past Medical History:  Diagnosis Date  . ADHD      Patient Active Problem List   Diagnosis Date Noted  . Ruptured appendicitis      Past Surgical History:  Procedure Laterality Date  . LAPAROSCOPIC APPENDECTOMY N/A 01/14/2017   Procedure: LAPAROSCOPIC DRAINAGE OF PELVIC ABSCESS;  Surgeon: Ricarda Frame, MD;  Location: [facility] ORS;  Service: General;  Laterality: N/A;     Prior to Admission medications   Medication Sig Start Date End Date Taking? Authorizing Provider  amoxicillin-clavulanate (AUGMENTIN) 875-125 MG tablet Take 1 tablet by mouth 2 (two) times daily. 01/20/17  Yes Lattie Haw, MD  HYDROcodone-acetaminophen (NORCO/VICODIN) 5-325 MG tablet Take 1-2 tablets by mouth every 4 (four) hours as needed for moderate pain or severe pain. 01/20/17  Yes Lattie Haw, MD     Allergies Patient has no known allergies.   No family history on file.  Social History Social History  Substance Use Topics  . Smoking status: Never Smoker  . Smokeless tobacco: Never Used  . Alcohol use Yes     Comment: occ    Review of Systems  Constitutional:   No fever or chills.  ENT:   No sore throat. No rhinorrhea. Cardiovascular:   No chest pain. Respiratory:   No dyspnea or  cough. Gastrointestinal:   Positive abdominal pain without vomiting and diarrhea.  Genitourinary:   Negative for dysuria or difficulty urinating. Musculoskeletal:   Negative for focal pain or swelling Neurological:   Negative for headaches 10-point ROS otherwise negative.  ____________________________________________   PHYSICAL EXAM:  VITAL SIGNS: ED Triage Vitals [01/24/17 1335]  Enc Vitals Group     BP 132/70     Pulse Rate (!) 112     Resp 20     Temp 98.7 F (37.1 C)     Temp Source Oral     SpO2 98 %     Weight 148 lb (67.1 kg)     Height  (1.702 m)     Head Circumference      Peak Flow      Pain Score 10     Pain Loc      Pain Edu?      Excl. in GC?     Vital signs reviewed, nursing assessments reviewed.   Constitutional:   Alert and oriented. Uncomfortable, not in distress. Eyes:   No scleral icterus. No conjunctival pallor. PERRL. EOMI.  No nystagmus. ENT   Head:   Normocephalic and atraumatic.   Nose:   No congestion/rhinnorhea. No septal hematoma   Mouth/Throat:   MMM, no pharyngeal erythema. No peritonsillar mass.    Neck:   No stridor. No SubQ emphysema. No meningismus. Hematological/Lymphatic/Immunilogical:   No cervical lymphadenopathy. Cardiovascular:   RRR. Symmetric bilateral radial and DP pulses.  No murmurs.  Respiratory:  Normal respiratory effort without tachypnea nor retractions. Breath sounds are clear and equal bilaterally. No wheezes/rales/rhonchi. Gastrointestinal:   Soft with diffuse tenderness, worse in the lower abdomen, with guarding. Non distended. There is no CVA tenderness.  Not rigid.  2 JP drains in the lower abdomen have clean exit site through the skin. Noninflamed. Watery drainage in the bulbs. Genitourinary:   deferred Musculoskeletal:   Normal range of motion in all extremities. No joint effusions.  No lower extremity tenderness.  No edema. Neurologic:   Normal speech and language.  CN 2-10 normal. Motor  grossly intact. No gross focal neurologic deficits are appreciated.  Skin:    Skin is warm, dry and intact. No rash noted.  No petechiae, purpura, or bullae.  ____________________________________________    LABS (pertinent positives/negatives) (all labs ordered are listed, but only abnormal results are displayed) Labs Reviewed  COMPREHENSIVE METABOLIC PANEL - Abnormal; Notable for the following:       Result Value   Glucose, Bld 139 (*)    Total Protein 8.2 (*)    All other components within normal limits  CBC WITH DIFFERENTIAL/PLATELET - Abnormal; Notable for the following:    WBC 31.5 (*)    Platelets 701 (*)    Neutro Abs 27.4 (*)    Monocytes Absolute 1.2 (*)    Basophils Absolute 0.2 (*)    All other components within normal limits  URINALYSIS, COMPLETE (UACMP) WITH MICROSCOPIC - Abnormal; Notable for the following:    Color, Urine YELLOW (*)    APPearance HAZY (*)    All other components within normal limits   ____________________________________________   EKG    ____________________________________________    RADIOLOGY  No results found.  ____________________________________________   PROCEDURES Procedures  ____________________________________________   INITIAL IMPRESSION / ASSESSMENT AND PLAN / ED COURSE  Pertinent labs & imaging results that were available during my care of the patient were reviewed by me and considered in my medical decision making (see chart for details).    Clinical Course as of Jan 24 1530  Sun Jan 24, 2017  1422 Patient recently postop from ruptured appendicitis presents with worsening abdominal pain, guarding, leukocytosis to 31,000 and tachycardic. We'll give IV fluids and Zosyn, repeat CT scan.  [PS]    Clinical Course User Index [PS] Sharman Cheek, MD     ----------------------------------------- 3:30 PM on 01/24/2017 -----------------------------------------  Still awaiting CT. Case discussed with Dr. Excell Seltzer  of surgery, requests update when CT is complete for further evaluation and likely surgical admission.  ____________________________________________   FINAL CLINICAL IMPRESSION(S) / ED DIAGNOSES  Final diagnoses:  Generalized abdominal pain  Leukocytosis, unspecified type      New Prescriptions   No medications on file     Portions of this note were generated with dragon dictation software. Dictation errors may occur despite best attempts at proofreading.    Sharman Cheek, MD 01/24/17 [phone]

## 2017-01-24 NOTE — H&P (Signed)
Jimmy Leach is an 21 y.o. male.    Chief Complaint: Abdominal pain  HPI: This patient status post laparoscopic appendectomy for acute appendicitis with a ruptured appendix by Dr. Adonis Huguenin. Patient was discharged after a course of IV antibiotics on oral antibiotics which she has been taking. He was on Augmentin. He was doing well and improving until today when he noticed worsening abdominal pain with nausea and vomiting but no fevers or chills. He points to the midportion of his abdomen. He does have drains in place. A workup in the emergency room with a CT scan suggests an abscess.  Past Medical History:  Diagnosis Date  . ADHD     Past Surgical History:  Procedure Laterality Date  . LAPAROSCOPIC APPENDECTOMY N/A 01/14/2017   Procedure: LAPAROSCOPIC DRAINAGE OF PELVIC ABSCESS;  Surgeon: Clayburn Pert, MD;  Location: [facility] ORS;  Service: General;  Laterality: N/A;    No family history on file. Social History:  reports that he has never smoked. He has never used smokeless tobacco. He reports that he drinks alcohol. He reports that he uses drugs, including Marijuana.  Allergies: No Known Allergies   (Not in a hospital admission)   Review of Systems  Constitutional: Negative for chills and fever.  HENT: Negative.   Eyes: Negative.   Respiratory: Negative.   Cardiovascular: Negative.   Gastrointestinal: Positive for abdominal pain, nausea and vomiting. Negative for blood in stool, constipation, diarrhea and heartburn.  Genitourinary: Negative.   Musculoskeletal: Negative.   Skin: Negative.   Neurological: Negative.   Endo/Heme/Allergies: Negative.   Psychiatric/Behavioral: Negative.      Physical Exam:  BP 121/86   Pulse 99   Temp 98.7 F (37.1 C) (Oral)   Resp 16   Ht '5\' 7"'$  (1.702 m)   Wt 148 lb (67.1 kg)   SpO2 99%   BMI 23.18 kg/m   Physical Exam  Constitutional: He is oriented to person, place, and time and well-developed, well-nourished, and in no  distress. No distress.  HENT:  Head: Normocephalic and atraumatic.  Eyes: Pupils are equal, round, and reactive to light. Right eye exhibits no discharge. Left eye exhibits no discharge. No scleral icterus.  Neck: Normal range of motion.  Cardiovascular: Normal rate and regular rhythm.   Pulmonary/Chest: Effort normal. No respiratory distress.  Abdominal: Soft. He exhibits distension. He exhibits no mass. There is tenderness. There is guarding. There is no rebound.  Maximal tenderness in the periumbilical area drains are in place with seropurulent drainage of a minimal nature. Mostly serous no erythema or drainage from the wounds  Musculoskeletal: Normal range of motion. He exhibits no edema.  Lymphadenopathy:    He has no cervical adenopathy.  Neurological: He is alert and oriented to person, place, and time.  Skin: Skin is warm and dry. No rash noted. He is not diaphoretic. No erythema.  Psychiatric: Mood and affect normal.  Vitals reviewed.       Results for orders placed or performed during the hospital encounter of 01/24/17 (from the past 48 hour(s))  Comprehensive metabolic panel     Status: Abnormal   Collection Time: 01/24/17  1:37 PM  Result Value Ref Range   Sodium 137 135 - 145 mmol/L   Potassium 3.6 3.5 - 5.1 mmol/L   Chloride 103 101 - 111 mmol/L   CO2 25 22 - 32 mmol/L   Glucose, Bld 139 (H) 65 - 99 mg/dL   BUN 18 6 - 20 mg/dL   Creatinine, Ser  1.04 0.61 - 1.24 mg/dL   Calcium 9.5 8.9 - 40.2 mg/dL   Total Protein 8.2 (H) 6.5 - 8.1 g/dL   Albumin 3.6 3.5 - 5.0 g/dL   AST 24 15 - 41 U/L   ALT 25 17 - 63 U/L   Alkaline Phosphatase 80 38 - 126 U/L   Total Bilirubin 0.9 0.3 - 1.2 mg/dL   GFR calc non Af Amer >60 >60 mL/min   GFR calc Af Amer >60 >60 mL/min    Comment: (NOTE) The eGFR has been calculated using the CKD EPI equation. This calculation has not been validated in all clinical situations. eGFR's persistently <60 mL/min signify possible Chronic  Kidney Disease.    Anion gap 9 5 - 15  CBC with Differential     Status: Abnormal   Collection Time: 01/24/17  1:37 PM  Result Value Ref Range   WBC 31.5 (H) 3.8 - 10.6 K/uL   RBC 4.69 4.40 - 5.90 MIL/uL   Hemoglobin 13.5 13.0 - 18.0 g/dL   HCT 70.0 43.2 - 97.1 %   MCV 85.7 80.0 - 100.0 fL   MCH 28.7 26.0 - 34.0 pg   MCHC 33.5 32.0 - 36.0 g/dL   RDW 02.4 68.3 - 88.3 %   Platelets 701 (H) 150 - 440 K/uL   Neutrophils Relative % 86 %   Neutro Abs 27.4 (H) 1.4 - 6.5 K/uL   Lymphocytes Relative 8 %   Lymphs Abs 2.6 1.0 - 3.6 K/uL   Monocytes Relative 4 %   Monocytes Absolute 1.2 (H) 0.2 - 1.0 K/uL   Eosinophils Relative 1 %   Eosinophils Absolute 0.2 0 - 0.7 K/uL   Basophils Relative 1 %   Basophils Absolute 0.2 (H) 0 - 0.1 K/uL  Urinalysis, Complete w Microscopic     Status: Abnormal   Collection Time: 01/24/17  1:37 PM  Result Value Ref Range   Color, Urine YELLOW (A) YELLOW   APPearance HAZY (A) CLEAR   Specific Gravity, Urine 1.020 1.005 - 1.030   pH 6.0 5.0 - 8.0   Glucose, UA NEGATIVE NEGATIVE mg/dL   Hgb urine dipstick NEGATIVE NEGATIVE   Bilirubin Urine NEGATIVE NEGATIVE   Ketones, ur NEGATIVE NEGATIVE mg/dL   Protein, ur NEGATIVE NEGATIVE mg/dL   Nitrite NEGATIVE NEGATIVE   Leukocytes, UA NEGATIVE NEGATIVE   RBC / HPF 0-5 0 - 5 RBC/hpf   WBC, UA 0-5 0 - 5 WBC/hpf   Bacteria, UA NONE SEEN NONE SEEN   Squamous Epithelial / LPF NONE SEEN NONE SEEN   Mucous PRESENT    Amorphous Crystal PRESENT    Ct Abdomen Pelvis W Contrast  Result Date: 01/24/2017 CLINICAL DATA:  Recent surgery for ruptured appendix, JP drain in place, worsening pain, increase [place] PC EXAM: CT ABDOMEN AND PELVIS WITH CONTRAST TECHNIQUE: Multidetector CT imaging of the abdomen and pelvis was performed using the standard protocol following bolus administration of intravenous contrast. CONTRAST:  ISOVUE-300 IOPAMIDOL (ISOVUE-300) INJECTION 61% COMPARISON:  01/13/2017 FINDINGS: Lower chest: Lung  bases shows no acute findings. Hepatobiliary: No focal liver abnormality is seen. No gallstones, gallbladder wall thickening, or biliary dilatation. Pancreas: Unremarkable. No pancreatic ductal dilatation or surrounding inflammatory changes. Spleen: Normal in size without focal abnormality. Adrenals/Urinary Tract: No adrenal gland mass. There is mild right hydronephrosis and mild right hydroureter. Delayed renal images shows mild delay excretion of the right ureter. Stomach/Bowel: No small bowel obstruction. Oral contrast material was given to the patient. There is  a left lower anterior wall JP drain Crossing in right lower quadrant with tip in right flank just inferior to inferior aspect of the liver. A second suprapubic JP drain is noted coiling in anterior pelvis with tip in right pelvis anteriorly. At the site of previous inflammatory process just medial to the cecum and lateral/above terminal ileum there is a recurrent fluid collection with enhancing wall measures at least 7 by 5.5 cm. Again noted a calcified appendicolith lateral aspect of this collection axial image 47. Findings are consistent with recurrent pelvic abscess post perforated appendicitis. Please note the tip of the drain is not within this collection. 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 There is mass effect or probable involvement of the right ureter concerning for causing right hydroureter. Axial image 62 the abscess measures 7.2 x 5.7 cm. Moderate stool noted within transverse colon probable ileus. There is some colonic stool and gas noted within distal sigmoid colon. Moderate gas noted within rectum. Vascular/Lymphatic: No aortic aneurysm. There are central mesenteric lymph nodes probable reactive the largest axial image 39 measures 1.2 cm. Reproductive: Prostate gland is unremarkable. Other: No definite evidence of urinary bladder involvement. No evidence of contrast  extravasation to suggest communication with the pelvic abscess. Small foci of free air are noted adjacent to the tip of the drainage catheter in right flank axial image 41 probable post instrumentation. Musculoskeletal: No destructive bony lesions are noted. Sagittal images of the spine are unremarkable. IMPRESSION: 1. Findings consistent with recurrent right pelvic abscess which is irregular with enhancing wall measures at least 7.2 by 5.7 cm. Recurrent abscess post perforated appendicitis. No evidence of contrast extravasation or communication with small bowel. 2. There are 2 pelvic drainage catheters as described above. Please note the abscess is separated from the catheters. 3. There is mass effect or involvement of the right ureter causing mild delay excretion of the right ureter. Mild right hydronephrosis. 4. There is thickening of terminal ileal wall just anterior and inferior to the abscess consistent with satellite enteritis. Electronically Signed   By: Lahoma Crocker M.D.   On: 01/24/2017 17:14     Assessment/Plan  CT scan is personally reviewed. There are signs of a rather large abscess on the right side of midline suggestive of abscess following ruptured appendix. He will be admitted the hospital hydrated and IV antibiotics will be instituted I will use Cipro Flagyl rather than the Zosyn/Augmentin that he had been on previously. I will schedule drainage with CT guidance for tomorrow. I are emphasized that if this is not possible he might require surgery but CT-guided drainage is the optimal choice here. He has family understood and agreed  Florene Glen, MD, FACS

## 2017-01-25 ENCOUNTER — Encounter: Payer: Self-pay | Admitting: Radiology

## 2017-01-25 ENCOUNTER — Inpatient Hospital Stay: Payer: Self-pay

## 2017-01-25 DIAGNOSIS — T814XXA Infection following a procedure, initial encounter: Secondary | ICD-10-CM

## 2017-01-25 LAB — CBC
HCT: 33.8 % — ABNORMAL LOW (ref 40.0–52.0)
HEMOGLOBIN: 11.4 g/dL — AB (ref 13.0–18.0)
MCH: 28.9 pg (ref 26.0–34.0)
MCHC: 33.7 g/dL (ref 32.0–36.0)
MCV: 85.7 fL (ref 80.0–100.0)
Platelets: 450 10*3/uL — ABNORMAL HIGH (ref 150–440)
RBC: 3.95 MIL/uL — ABNORMAL LOW (ref 4.40–5.90)
RDW: 13.6 % (ref 11.5–14.5)
WBC: 27.5 10*3/uL — ABNORMAL HIGH (ref 3.8–10.6)

## 2017-01-25 LAB — URINE DRUG SCREEN, QUALITATIVE (ARMC ONLY)
AMPHETAMINES, UR SCREEN: NOT DETECTED
BENZODIAZEPINE, UR SCRN: NOT DETECTED
Barbiturates, Ur Screen: NOT DETECTED
CANNABINOID 50 NG, UR ~~LOC~~: POSITIVE — AB
Cocaine Metabolite,Ur ~~LOC~~: NOT DETECTED
MDMA (ECSTASY) UR SCREEN: NOT DETECTED
Methadone Scn, Ur: NOT DETECTED
OPIATE, UR SCREEN: POSITIVE — AB
PHENCYCLIDINE (PCP) UR S: NOT DETECTED
Tricyclic, Ur Screen: NOT DETECTED

## 2017-01-25 LAB — PROTIME-INR
INR: 1.14
PROTHROMBIN TIME: 14.7 s (ref 11.4–15.2)

## 2017-01-25 MED ORDER — FENTANYL CITRATE (PF) 100 MCG/2ML IJ SOLN
INTRAMUSCULAR | Status: AC | PRN
  Administered 2017-01-25: 50 ug via INTRAVENOUS
  Administered 2017-01-25 (×2): 25 ug via INTRAVENOUS

## 2017-01-25 MED ORDER — MIDAZOLAM HCL 5 MG/5ML IJ SOLN
INTRAMUSCULAR | Status: AC | PRN
  Administered 2017-01-25 (×2): 0.5 mg via INTRAVENOUS
  Administered 2017-01-25 (×2): 1 mg via INTRAVENOUS

## 2017-01-25 MED ORDER — MIDAZOLAM HCL 5 MG/5ML IJ SOLN
INTRAMUSCULAR | Status: AC
  Filled 2017-01-25: qty 5

## 2017-01-25 MED ORDER — KETOROLAC TROMETHAMINE 30 MG/ML IJ SOLN
30.0000 mg | Freq: Four times a day (QID) | INTRAMUSCULAR | Status: DC
  Administered 2017-01-25 – 2017-01-29 (×16): 30 mg via INTRAVENOUS
  Filled 2017-01-25 (×16): qty 1

## 2017-01-25 MED ORDER — HEPARIN SODIUM (PORCINE) 5000 UNIT/ML IJ SOLN
5000.0000 [IU] | Freq: Three times a day (TID) | INTRAMUSCULAR | Status: DC
  Administered 2017-01-25 – 2017-01-28 (×9): 5000 [IU] via SUBCUTANEOUS
  Filled 2017-01-25 (×10): qty 1

## 2017-01-25 MED ORDER — IOPAMIDOL (ISOVUE-300) INJECTION 61%
100.0000 mL | Freq: Once | INTRAVENOUS | Status: AC | PRN
  Administered 2017-01-25: 100 mL via INTRAVENOUS

## 2017-01-25 MED ORDER — FENTANYL CITRATE (PF) 100 MCG/2ML IJ SOLN
INTRAMUSCULAR | Status: AC
  Filled 2017-01-25: qty 4

## 2017-01-25 NOTE — Procedures (Signed)
s/p CT guided RLQ ABSCESS DRAIN  No complications  50 mL bloody purulent fluid aspirated  Culture sent  EBL 0  Drain connect to external suction bulb  Full report in PACs

## 2017-01-25 NOTE — Care Management (Addendum)
Spoke with patient to see if he had accessed previous referral to [facility] or [facility]. He states that "his mother would know all that stuff" but he didn't remember using it. It appear that patient may have used [facility] but that is unclear also. RNCM will need to follow for medication assistance. Message/referral made to [facility]. Patient's contact is [phone] and [phone] Hilda Lias is his mother's contact. This RNCM spoke with Hilda Lias and she was at her own MD appointment- I asked her to call me back when she got out- she agreed. In the mean time, [facility] and [facility] has no history with this patient.

## 2017-01-25 NOTE — Progress Notes (Signed)
01/25/2017  Subjective: Patient was admitted yesterday with new onset pain in the right lower quadrant and CT scan findings of a new abscess. Patient had a laparoscopic drainage of pelvic abscess on 4/5 with Dr. Tonita Cong for ruptured appendicitis. 2 JP drains remain in place and abscess is in between the drains and thus not able to be drained by them.  Vital signs: Temp:  [98.6 F (37 C)-102.9 F (39.4 C)] 98.6 F (37 C) (04/16 0436) Pulse Rate:  [97-124] 97 (04/16 0436) Resp:  [16-20] 18 (04/16 0436) BP: (118-144)/(56-86) 126/67 (04/16 0436) SpO2:  [93 %-100 %] 96 % (04/16 0436) Weight:  [67.1 kg (148 lb)] 67.1 kg (148 lb) (04/15 1335)   Intake/Output: 04/15 0701 - 04/16 0700 In: 3289 [I.V.:1209; IV Piggyback:2050] Out: 0  Last BM Date: 01/24/17 (per pt)  Physical Exam: Constitutional: No acute distress Abdomen:  Soft, nondistended, tender to palpation in the lower abdomen to right lower quadrant. JP drains in place with seropurulent fluid.  Labs:   Recent Labs  01/24/17 1337 01/25/17 0434  WBC 31.5* 27.5*  HGB 13.5 11.4*  HCT 40.1 33.8*  PLT 701* 450*    Recent Labs  01/24/17 1337  NA 137  K 3.6  CL 103  CO2 25  GLUCOSE 139*  BUN 18  CREATININE 1.04  CALCIUM 9.5    Recent Labs  01/25/17 0434  LABPROT 14.7  INR 1.14    Imaging: Ct Abdomen Pelvis W Contrast  Result Date: 01/24/2017 CLINICAL DATA:  Recent surgery for ruptured appendix, JP drain in place, worsening pain, increase [place] PC EXAM: CT ABDOMEN AND PELVIS WITH CONTRAST TECHNIQUE: Multidetector CT imaging of the abdomen and pelvis was performed using the standard protocol following bolus administration of intravenous contrast. CONTRAST:  ISOVUE-300 IOPAMIDOL (ISOVUE-300) INJECTION 61% COMPARISON:  01/13/2017 FINDINGS: Lower chest: Lung bases shows no acute findings. Hepatobiliary: No focal liver abnormality is seen. No gallstones, gallbladder wall thickening, or biliary dilatation. Pancreas:  Unremarkable. No pancreatic ductal dilatation or surrounding inflammatory changes. Spleen: Normal in size without focal abnormality. Adrenals/Urinary Tract: No adrenal gland mass. There is mild right hydronephrosis and mild right hydroureter. Delayed renal images shows mild delay excretion of the right ureter. Stomach/Bowel: No small bowel obstruction. Oral contrast material was given to the patient. There is a left lower anterior wall JP drain Crossing in right lower quadrant with tip in right flank just inferior to inferior aspect of the liver. A second suprapubic JP drain is noted coiling in anterior pelvis with tip in right pelvis anteriorly. At the site of previous inflammatory process just medial to the cecum and lateral/above terminal ileum there is a recurrent fluid collection with enhancing wall measures at least 7 by 5.5 cm. Again noted a calcified appendicolith lateral aspect of this collection axial image 47. Findings are consistent with recurrent pelvic abscess post perforated appendicitis. Please note the tip of the drain is not within this collection. 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 There is mass effect or probable involvement of the right ureter concerning for causing right hydroureter. Axial image 62 the abscess measures 7.2 x 5.7 cm. Moderate stool noted within transverse colon probable ileus. There is some colonic stool and gas noted within distal sigmoid colon. Moderate gas noted within rectum. Vascular/Lymphatic: No aortic aneurysm. There are central mesenteric lymph nodes probable reactive the largest axial image 39 measures 1.2 cm. Reproductive: Prostate gland is unremarkable. Other: No definite evidence  of urinary bladder involvement. No evidence of contrast extravasation to suggest communication with the pelvic abscess. Small foci of free air are noted adjacent to the tip of the drainage catheter in right flank axial  image 41 probable post instrumentation. Musculoskeletal: No destructive bony lesions are noted. Sagittal images of the spine are unremarkable. IMPRESSION: 1. Findings consistent with recurrent right pelvic abscess which is irregular with enhancing wall measures at least 7.2 by 5.7 cm. Recurrent abscess post perforated appendicitis. No evidence of contrast extravasation or communication with small bowel. 2. There are 2 pelvic drainage catheters as described above. Please note the abscess is separated from the catheters. 3. There is mass effect or involvement of the right ureter causing mild delay excretion of the right ureter. Mild right hydronephrosis. 4. There is thickening of terminal ileal wall just anterior and inferior to the abscess consistent with satellite enteritis. Electronically Signed   By: Natasha Mead M.D.   On: 01/24/2017 17:14    Assessment/Plan: 21 year old male with recurrent abscess status post upper scopic drainage of pelvic abscess for ruptured appendicitis.  -Patient will go to measure radiology today to attempt drainage of abscess. Patient understands that if unsuccessful, given the size, he may require another operation for drainage. We'll discuss further with the patient following interventional radiology. -Patient will remain nothing by mouth for now until procedures done and we'll hold subcutaneous heparin until procedures done as well. -Continue IV fluids and IV antibiotics.   Howie Ill, MD [facility]

## 2017-01-26 LAB — CBC WITH DIFFERENTIAL/PLATELET
BASOS PCT: 0 %
Basophils Absolute: 0.1 10*3/uL (ref 0–0.1)
EOS PCT: 1 %
Eosinophils Absolute: 0.2 10*3/uL (ref 0–0.7)
HEMATOCRIT: 33 % — AB (ref 40.0–52.0)
Hemoglobin: 11 g/dL — ABNORMAL LOW (ref 13.0–18.0)
Lymphocytes Relative: 10 %
Lymphs Abs: 1.8 10*3/uL (ref 1.0–3.6)
MCH: 28.6 pg (ref 26.0–34.0)
MCHC: 33.3 g/dL (ref 32.0–36.0)
MCV: 85.9 fL (ref 80.0–100.0)
MONOS PCT: 6 %
Monocytes Absolute: 1.1 10*3/uL — ABNORMAL HIGH (ref 0.2–1.0)
NEUTROS ABS: 15.2 10*3/uL — AB (ref 1.4–6.5)
Neutrophils Relative %: 83 %
PLATELETS: 430 10*3/uL (ref 150–440)
RBC: 3.85 MIL/uL — ABNORMAL LOW (ref 4.40–5.90)
RDW: 13.7 % (ref 11.5–14.5)
WBC: 18.4 10*3/uL — ABNORMAL HIGH (ref 3.8–10.6)

## 2017-01-26 LAB — BASIC METABOLIC PANEL
Anion gap: 7 (ref 5–15)
BUN: 12 mg/dL (ref 6–20)
CO2: 27 mmol/L (ref 22–32)
Calcium: 8.4 mg/dL — ABNORMAL LOW (ref 8.9–10.3)
Chloride: 102 mmol/L (ref 101–111)
Creatinine, Ser: 1 mg/dL (ref 0.61–1.24)
GFR calc non Af Amer: >60 mL/min (ref >60)
GLUCOSE: 109 mg/dL — AB (ref 65–99)
Potassium: 3.2 mmol/L — ABNORMAL LOW (ref 3.5–5.1)
Sodium: 136 mmol/L (ref 135–145)

## 2017-01-26 LAB — MAGNESIUM: MAGNESIUM: 1.5 mg/dL — AB (ref 1.7–2.4)

## 2017-01-26 MED ORDER — POTASSIUM CHLORIDE CRYS ER 20 MEQ PO TBCR
40.0000 meq | EXTENDED_RELEASE_TABLET | Freq: Once | ORAL | Status: AC
  Administered 2017-01-26: 40 meq via ORAL
  Filled 2017-01-26: qty 2

## 2017-01-26 MED ORDER — PANTOPRAZOLE SODIUM 40 MG IV SOLR
40.0000 mg | Freq: Every day | INTRAVENOUS | Status: DC
  Administered 2017-01-27 – 2017-01-28 (×3): 40 mg via INTRAVENOUS
  Filled 2017-01-26 (×3): qty 40

## 2017-01-26 MED ORDER — MAGNESIUM SULFATE 2 GM/50ML IV SOLN
2.0000 g | Freq: Once | INTRAVENOUS | Status: AC
  Administered 2017-01-26: 2 g via INTRAVENOUS
  Filled 2017-01-26: qty 50

## 2017-01-26 MED ORDER — DIPHENHYDRAMINE HCL 50 MG/ML IJ SOLN
25.0000 mg | Freq: Once | INTRAMUSCULAR | Status: AC
  Administered 2017-01-26: 25 mg via INTRAVENOUS
  Filled 2017-01-26: qty 1

## 2017-01-26 MED ORDER — ALUM & MAG HYDROXIDE-SIMETH 200-200-20 MG/5ML PO SUSP
30.0000 mL | ORAL | Status: DC | PRN
  Administered 2017-01-26: 30 mL via ORAL
  Filled 2017-01-26: qty 30

## 2017-01-26 NOTE — Care Management (Addendum)
This RNCM called patient's mother back Hilda Lias) at [phone] email [email or web address]. She said she has a lot going on with herself now and mentioned that she is trying to get patient on Medicaid. She agrees to Medication [facility] and possibly [facility]. I have emailed application and included instructions.

## 2017-01-26 NOTE — Progress Notes (Signed)
01/26/2017  Subjective: Patient has CT-guided drainage of intra-abdominal abscess status post laparoscopic drainage of pelvic abscess from a ruptured appendicitis. No acute events overnight. Patient tolerated clear liquid diet well. He feels hungry today. Pain is controlled. No significant nausea or pain.  Vital signs: Temp:  [97.5 F (36.4 C)-99.1 F (37.3 C)] 98.7 F (37.1 C) (04/17 0539) Pulse Rate:  [58-99] 99 (04/17 0539) Resp:  [18-20] 18 (04/17 0539) BP: (124-132)/(72-84) 124/72 (04/17 0539) SpO2:  [94 %-97 %] 97 % (04/17 0539)   Intake/Output: 04/16 0701 - 04/17 0700 In: 3645.9 [P.O.:240; I.V.:2755.9; IV Piggyback:640] Out: 1954 [Urine:1850; Drains:104] Last BM Date: 01/24/17 (per pt)  Physical Exam: Constitutional: No acute distress Abdomen:  Soft, nondistended, properly tender over the drainage site. New drain with purulent fluid in the bulb. Patient still has 2 JP drains from prior surgery with purulent fluid.  Labs:   Recent Labs  01/25/17 0434 01/26/17 0437  WBC 27.5* 18.4*  HGB 11.4* 11.0*  HCT 33.8* 33.0*  PLT 450* 430    Recent Labs  01/24/17 1337 01/26/17 0437  NA 137 136  K 3.6 3.2*  CL 103 102  CO2 25 27  GLUCOSE 139* 109*  BUN 18 12  CREATININE 1.04 1.00  CALCIUM 9.5 8.4*    Recent Labs  01/25/17 0434  LABPROT 14.7  INR 1.14    Imaging: No results found.  Assessment/Plan: 21 year old male with intra-abdominal abscess.  -Continue IV antibiotics today. Advance diet to soft diet this morning. -Discontinue IV fluids as the patient tolerates diet. -Pending cultures for further antibiotic management. -Continue drains in place to bulb suction.   Howie Ill, MD [facility]

## 2017-01-27 LAB — CBC
HEMATOCRIT: 31.9 % — AB (ref 40.0–52.0)
Hemoglobin: 10.6 g/dL — ABNORMAL LOW (ref 13.0–18.0)
MCH: 28 pg (ref 26.0–34.0)
MCHC: 33.2 g/dL (ref 32.0–36.0)
MCV: 84.4 fL (ref 80.0–100.0)
PLATELETS: 439 10*3/uL (ref 150–440)
RBC: 3.78 MIL/uL — AB (ref 4.40–5.90)
RDW: 13.6 % (ref 11.5–14.5)
WBC: 13 10*3/uL — AB (ref 3.8–10.6)

## 2017-01-27 LAB — C DIFFICILE QUICK SCREEN W PCR REFLEX
C DIFFICILE (CDIFF) INTERP: NOT DETECTED
C Diff antigen: NEGATIVE
C Diff toxin: NEGATIVE

## 2017-01-27 MED ORDER — SODIUM CHLORIDE 0.9 % IV SOLN
INTRAVENOUS | Status: DC
  Administered 2017-01-27 – 2017-01-28 (×3): via INTRAVENOUS

## 2017-01-27 MED ORDER — SODIUM CHLORIDE 0.9 % IV SOLN
1.0000 g | INTRAVENOUS | Status: DC
  Administered 2017-01-27 – 2017-01-28 (×2): 1 g via INTRAVENOUS
  Filled 2017-01-27 (×2): qty 1

## 2017-01-27 MED ORDER — SODIUM CHLORIDE 0.9 % IV BOLUS (SEPSIS)
500.0000 mL | Freq: Once | INTRAVENOUS | Status: AC
  Administered 2017-01-27: 500 mL via INTRAVENOUS

## 2017-01-27 NOTE — Progress Notes (Signed)
Dr. Aleen Campi was notified of more than 4 liquid stool  Within 24 hour per patient. Order received.

## 2017-01-27 NOTE — Progress Notes (Signed)
Pharmacy Antibiotic Note  Jimmy Leach is a 21 y.o. male admitted on 01/24/2017 with recurrent abscess after pelvic abscess drainage from ruptured appendicitis. Cultures showing ESBL E.Coli.  Pharmacy has been consulted for  Ertapenem dosing.   Plan: Will start patient on ertapenem 1gm IV every 24 hours.   Height:  (170.2 cm) Weight: 148 lb (67.1 kg) IBW/kg (Calculated) : 66.1  Temp (24hrs), Avg:98 F (36.7 C), Min:97.7 F (36.5 C), Max:98.3 F (36.8 C)   Recent Labs Lab 01/24/17 1337 01/25/17 0434 01/26/17 0437 01/27/17 0431  WBC 31.5* 27.5* 18.4* 13.0*  CREATININE 1.04  --  1.00  --     Estimated Creatinine Clearance: 108.3 mL/min (by C-G formula based on SCr of 1 mg/dL).    No Known Allergies  Antimicrobials this admission: 4/15 Cipro/[place] >>  4/18 Ertapenem>>   Dose adjustments this admission:  Microbiology results: 4/18 Cx: ESBL E. Coli    Thank you for allowing pharmacy to be a part of this patient's care.  Gardner Candle, PharmD, BCPS Clinical Pharmacist 01/27/2017 3:35 PM

## 2017-01-27 NOTE — Progress Notes (Signed)
Patient is C-diff negative.Enteric Isolation dc'd.

## 2017-01-27 NOTE — Progress Notes (Signed)
01/27/2017  Subjective: Patient had multiple bouts of diarrhea yesterday as well as one episode of emesis.  Reports no worsening pain.  Vital signs: Temp:  [97.7 F (36.5 C)-98.3 F (36.8 C)] 98 F (36.7 C) (04/18 0559) Pulse Rate:  [88-102] 93 (04/18 0559) Resp:  [19-20] 20 (04/18 0559) BP: (122-126)/(73-88) 122/82 (04/18 0559) SpO2:  [97 %-100 %] 98 % (04/18 0559)   Intake/Output: 04/17 0701 - 04/18 0700 In: 570 [P.O.:60; IV Piggyback:500] Out: 1538 [Urine:901; Emesis/NG output:600; Drains:35; Stool:2] Last BM Date: 01/26/17  Physical Exam: Constitutional: No acute distress Abdomen:  Soft, nondistended, with appropriate tenderness to palpation.  3 drains in place to bulb suction, with purulent fluid.    Labs:   Recent Labs  01/26/17 0437 01/27/17 0431  WBC 18.4* 13.0*  HGB 11.0* 10.6*  HCT 33.0* 31.9*  PLT 430 439    Recent Labs  01/24/17 1337 01/26/17 0437  NA 137 136  K 3.6 3.2*  CL 103 102  CO2 25 27  GLUCOSE 139* 109*  BUN 18 12  CREATININE 1.04 1.00  CALCIUM 9.5 8.4*    Recent Labs  01/25/17 0434  LABPROT 14.7  INR 1.14    Imaging: No results found.  Assessment/Plan: 21 yo male with recurrent abscess after pelvic abscess drainage from ruptured appendicitis  --Patient probably had a mild postop ileus leading to his emesis and now bilious diarrhea.  Will continue diet as tolerated for today.  If further GI issues, would consider KUB to further evaluate his bowels. --continue IV antibiotics for today, as his WBC is still elevated.  Possibly transition tomorrow to oral depending on his progress.   Howie Ill, MD [facility]

## 2017-01-28 LAB — BASIC METABOLIC PANEL
Anion gap: 6 (ref 5–15)
BUN: 10 mg/dL (ref 6–20)
CHLORIDE: 107 mmol/L (ref 101–111)
CO2: 25 mmol/L (ref 22–32)
CREATININE: 0.78 mg/dL (ref 0.61–1.24)
Calcium: 8.2 mg/dL — ABNORMAL LOW (ref 8.9–10.3)
GFR calc Af Amer: >60 mL/min (ref >60)
GFR calc non Af Amer: >60 mL/min (ref >60)
Glucose, Bld: 106 mg/dL — ABNORMAL HIGH (ref 65–99)
POTASSIUM: 3.1 mmol/L — AB (ref 3.5–5.1)
SODIUM: 138 mmol/L (ref 135–145)

## 2017-01-28 LAB — MAGNESIUM: Magnesium: 1.5 mg/dL — ABNORMAL LOW (ref 1.7–2.4)

## 2017-01-28 LAB — CBC
HEMATOCRIT: 30.8 % — AB (ref 40.0–52.0)
HEMOGLOBIN: 10.4 g/dL — AB (ref 13.0–18.0)
MCH: 28.8 pg (ref 26.0–34.0)
MCHC: 33.8 g/dL (ref 32.0–36.0)
MCV: 85.2 fL (ref 80.0–100.0)
Platelets: 436 10*3/uL (ref 150–440)
RBC: 3.62 MIL/uL — AB (ref 4.40–5.90)
RDW: 13.6 % (ref 11.5–14.5)
WBC: 11.4 10*3/uL — ABNORMAL HIGH (ref 3.8–10.6)

## 2017-01-28 MED ORDER — MAGNESIUM SULFATE 2 GM/50ML IV SOLN
2.0000 g | Freq: Once | INTRAVENOUS | Status: AC
Start: 2017-01-28 — End: 2017-01-28
  Administered 2017-01-28: 2 g via INTRAVENOUS
  Filled 2017-01-28: qty 50

## 2017-01-28 MED ORDER — SULFAMETHOXAZOLE-TRIMETHOPRIM 800-160 MG PO TABS
1.0000 | ORAL_TABLET | Freq: Two times a day (BID) | ORAL | Status: DC
  Administered 2017-01-28 – 2017-01-29 (×2): 1 via ORAL
  Filled 2017-01-28 (×2): qty 1

## 2017-01-28 MED ORDER — PERMETHRIN 1 % EX LOTN
TOPICAL_LOTION | Freq: Once | CUTANEOUS | Status: DC
  Filled 2017-01-28: qty 59

## 2017-01-28 MED ORDER — MAGNESIUM SULFATE IN D5W 1-5 GM/100ML-% IV SOLN
1.0000 g | Freq: Once | INTRAVENOUS | Status: AC
  Administered 2017-01-28: 1 g via INTRAVENOUS
  Filled 2017-01-28: qty 100

## 2017-01-28 MED ORDER — MAGNESIUM SULFATE 50 % IJ SOLN: 3.0000 g | Freq: Once | INTRAVENOUS | Status: DC

## 2017-01-28 MED ORDER — PERMETHRIN 5 % EX CREA
TOPICAL_CREAM | Freq: Once | CUTANEOUS | Status: AC
  Administered 2017-01-28: 17:00:00 via TOPICAL
  Filled 2017-01-28: qty 60

## 2017-01-28 MED ORDER — POTASSIUM CHLORIDE CRYS ER 20 MEQ PO TBCR
40.0000 meq | EXTENDED_RELEASE_TABLET | Freq: Once | ORAL | Status: AC
  Administered 2017-01-28: 40 meq via ORAL
  Filled 2017-01-28: qty 2

## 2017-01-28 MED ORDER — AMOXICILLIN-POT CLAVULANATE 875-125 MG PO TABS
1.0000 | ORAL_TABLET | Freq: Two times a day (BID) | ORAL | Status: DC
  Administered 2017-01-28 – 2017-01-29 (×2): 1 via ORAL
  Filled 2017-01-28 (×2): qty 1

## 2017-01-28 NOTE — Progress Notes (Signed)
01/28/2017  Subjective: Patient tested negative for C. difficile yesterday and enteric precautions were lifted. However his abscess cultures are showing Escherichia coli ESBL and instead on contact precautions. Denies any worsening pain. His diarrhea has improved. No further nausea. IV fluids were started in the evening due to lightheadedness likely from his diarrhea episodes.  Vital signs: Temp:  [98.3 F (36.8 C)-98.6 F (37 C)] 98.6 F (37 C) (04/19 0509) Pulse Rate:  [65-88] 73 (04/19 0509) Resp:  [20] 20 (04/19 0509) BP: (110-116)/(59-74) 111/68 (04/19 0509) SpO2:  [97 %-99 %] 97 % (04/19 0509)   Intake/Output: 04/18 0701 - 04/19 0700 In: 2300 [P.O.:480; I.V.:1265; IV Piggyback:550] Out: 32.5 [Drains:32.5] Last BM Date: 01/27/17  Physical Exam: Constitutional: No acute distress Abdomen:  Soft, nondistended, nontender to palpation. All drains in place with seropurulent fluid.  Labs:   Recent Labs  01/27/17 0431 01/28/17 0606  WBC 13.0* 11.4*  HGB 10.6* 10.4*  HCT 31.9* 30.8*  PLT 439 436    Recent Labs  01/26/17 0437 01/28/17 0606  NA 136 138  K 3.2* 3.1*  CL 102 107  CO2 27 25  GLUCOSE 109* 106*  BUN 12 10  CREATININE 1.00 0.78  CALCIUM 8.4* 8.2*   No results for input(s): LABPROT, INR in the last 72 hours.  Imaging: No results found.  Assessment/Plan: 21 year old male with intra-abdominal abscess status post laparoscopic pelvic abscess drainage.  -We'll discuss with infectious disease about antibiotic regimen for home for his Escherichia coli ESBL. -Continue regular diet and IV Invanz for now. -Continue low-grade IV fluids as the patient yesterday was feeling lightheaded.   Howie Ill, MD [facility]

## 2017-01-28 NOTE — Consult Note (Signed)
[facility] Infectious Disease     Reason for Consult: ESBL E Coli   Referring Physician: Kathlen Mody Date of Admission:  01/24/2017   Active Problems:   Postprocedural intraabdominal abscess   HPI: Jimmy Leach is a 21 y.o. male initially admitted 4/4 with several days abd pain and found to have ruptured appendicitis. Underwent surgery and was dced 4/10 on oral augmentin with JP drains in place. Readmitted 4.15 with recurrent pain and fever and found to have elevated WBC and CT showed a pelvic abscess. Underwent IR placement of drain on 4/16 with cxs growing ESBL E coli. He was changed to ertapenem and his wbc has improved and no further fevers. He feels much better today and hopes to be dced soon.   Past Medical History:  Diagnosis Date  . ADHD    Past Surgical History:  Procedure Laterality Date  . LAPAROSCOPIC APPENDECTOMY N/A 01/14/2017   Procedure: LAPAROSCOPIC DRAINAGE OF PELVIC ABSCESS;  Surgeon: Clayburn Pert, MD;  Location: [facility] ORS;  Service: General;  Laterality: N/A;   Social History  Substance Use Topics  . Smoking status: Never Smoker  . Smokeless tobacco: Never Used  . Alcohol use Yes     Comment: occ   History reviewed. No pertinent family history.  Allergies: No Known Allergies  Current antibiotics: Antibiotics Given (last 72 hours)    Date/Time Action Medication Dose Rate   01/25/17 1205 Given   ciprofloxacin (CIPRO) IVPB 400 mg 400 mg 200 mL/hr   01/25/17 2102 Given   ciprofloxacin (CIPRO) IVPB 400 mg 400 mg 200 mL/hr   01/26/17 1000 Given   ciprofloxacin (CIPRO) IVPB 400 mg 400 mg 200 mL/hr   01/26/17 2134 Given   ciprofloxacin (CIPRO) IVPB 400 mg 400 mg 200 mL/hr   01/27/17 0939 Given   ciprofloxacin (CIPRO) IVPB 400 mg 400 mg 200 mL/hr      MEDICATIONS: . heparin subcutaneous  5,000 Units Subcutaneous Q8H  . ketorolac  30 mg Intravenous Q6H  . Melatonin  5 mg Oral QHS  . pantoprazole (PROTONIX) IV  40 mg Intravenous QHS    Review of  Systems - 11 systems reviewed and negative per HPI   OBJECTIVE: Temp:  [98.3 F (36.8 C)-98.6 F (37 C)] 98.6 F (37 C) (04/19 0509) Pulse Rate:  [65-88] 73 (04/19 0509) Resp:  [20] 20 (04/19 0509) BP: (110-116)/(59-74) 111/68 (04/19 0509) SpO2:  [97 %-99 %] 97 % (04/19 0509) Physical Exam  Constitutional: He is oriented to person, place, and time. He appears well-developed and well-nourished. No distress.  HENT: anicteric Mouth/Throat: Oropharynx is clear and moist. No oropharyngeal exudate.  Cardiovascular: Normal rate, regular rhythm and normal heart sounds.  Pulmonary/Chest: Effort normal and breath sounds normal. No respiratory distress. He has no wheezes.  Abdominal: Soft. 3 JP drains in place with ss drainage, mild ttp Lymphadenopathy:  He has no cervical adenopathy.  Neurological: He is alert and oriented to person, place, and time.  Skin: Skin is warm and dry. No rash noted. No erythema.  Psychiatric: He has a normal mood and affect. His behavior is normal.    LABS: Results for orders placed or performed during the hospital encounter of 01/24/17 (from the past 48 hour(s))  CBC     Status: Abnormal   Collection Time: 01/27/17  4:31 AM  Result Value Ref Range   WBC 13.0 (H) 3.8 - 10.6 K/uL   RBC 3.78 (L) 4.40 - 5.90 MIL/uL   Hemoglobin 10.6 (L) 13.0 -  18.0 g/dL   HCT 31.9 (L) 40.0 - 52.0 %   MCV 84.4 80.0 - 100.0 fL   MCH 28.0 26.0 - 34.0 pg   MCHC 33.2 32.0 - 36.0 g/dL   RDW 13.6 11.5 - 14.5 %   Platelets 439 150 - 440 K/uL  C difficile quick scan w PCR reflex     Status: None   Collection Time: 01/27/17  3:46 PM  Result Value Ref Range   C Diff antigen NEGATIVE NEGATIVE   C Diff toxin NEGATIVE NEGATIVE   C Diff interpretation No C. difficile detected.   CBC     Status: Abnormal   Collection Time: 01/28/17  6:06 AM  Result Value Ref Range   WBC 11.4 (H) 3.8 - 10.6 K/uL   RBC 3.62 (L) 4.40 - 5.90 MIL/uL   Hemoglobin 10.4 (L) 13.0 - 18.0 g/dL   HCT 30.8 (L)  40.0 - 52.0 %   MCV 85.2 80.0 - 100.0 fL   MCH 28.8 26.0 - 34.0 pg   MCHC 33.8 32.0 - 36.0 g/dL   RDW 13.6 11.5 - 14.5 %   Platelets 436 150 - 440 K/uL  Basic metabolic panel     Status: Abnormal   Collection Time: 01/28/17  6:06 AM  Result Value Ref Range   Sodium 138 135 - 145 mmol/L   Potassium 3.1 (L) 3.5 - 5.1 mmol/L   Chloride 107 101 - 111 mmol/L   CO2 25 22 - 32 mmol/L   Glucose, Bld 106 (H) 65 - 99 mg/dL   BUN 10 6 - 20 mg/dL   Creatinine, Ser 0.78 0.61 - 1.24 mg/dL   Calcium 8.2 (L) 8.9 - 10.3 mg/dL   GFR calc non Af Amer >60 >60 mL/min   GFR calc Af Amer >60 >60 mL/min    Comment: (NOTE) The eGFR has been calculated using the CKD EPI equation. This calculation has not been validated in all clinical situations. eGFR's persistently <60 mL/min signify possible Chronic Kidney Disease.    Anion gap 6 5 - 15  Magnesium     Status: Abnormal   Collection Time: 01/28/17  6:06 AM  Result Value Ref Range   Magnesium 1.5 (L) 1.7 - 2.4 mg/dL   No components found for: ESR, C REACTIVE PROTEIN MICRO: Recent Results (from the past 720 hour(s))  Surgical PCR screen     Status: Abnormal   Collection Time: 01/13/17 10:47 PM  Result Value Ref Range Status   MRSA, PCR NEGATIVE NEGATIVE Final   Staphylococcus aureus POSITIVE (A) NEGATIVE Final    Comment:        The Xpert SA Assay (FDA approved for NASAL specimens in patients over 77 years of age), is one component of a comprehensive surveillance program.  Test performance has been validated by [facility] for patients greater than or equal to 48 year old. It is not intended to diagnose infection nor to guide or monitor treatment.   Aerobic/Anaerobic Culture (surgical/deep wound)     Status: None (Preliminary result)   Collection Time: 01/25/17 11:54 AM  Result Value Ref Range Status   Specimen Description ABDOMEN  Final   Special Requests Normal  Final   Gram Stain   Final    ABUNDANT WBC PRESENT, PREDOMINANTLY  PMN ABUNDANT GRAM NEGATIVE RODS ABUNDANT GRAM POSITIVE COCCI IN PAIRS IN CHAINS ABUNDANT GRAM POSITIVE RODS    Culture   Final    MODERATE ESCHERICHIA COLI Confirmed Extended Spectrum Beta-Lactamase Producer (ESBL) HOLDING  FOR POSSIBLE ANAEROBE Performed at [facility], [place] [address], [place], [place] [address]    Report Status PENDING  Incomplete   Organism ID, Bacteria ESCHERICHIA COLI  Final      Susceptibility   Escherichia coli - MIC*    AMPICILLIN >=32 RESISTANT Resistant     CEFAZOLIN >=64 RESISTANT Resistant     CEFEPIME RESISTANT Resistant     CEFTAZIDIME RESISTANT Resistant     CEFTRIAXONE >=64 RESISTANT Resistant     CIPROFLOXACIN >=4 RESISTANT Resistant     GENTAMICIN <=1 SENSITIVE Sensitive     IMIPENEM <=0.25 SENSITIVE Sensitive     TRIMETH/SULFA <=20 SENSITIVE Sensitive     AMPICILLIN/SULBACTAM >=32 RESISTANT Resistant     PIP/TAZO 64 INTERMEDIATE Intermediate     Extended ESBL POSITIVE Resistant     * MODERATE ESCHERICHIA COLI  C difficile quick scan w PCR reflex     Status: None   Collection Time: 01/27/17  3:46 PM  Result Value Ref Range Status   C Diff antigen NEGATIVE NEGATIVE Final   C Diff toxin NEGATIVE NEGATIVE Final   C Diff interpretation No C. difficile detected.  Final    IMAGING: Ct Abdomen Pelvis W Contrast  Result Date: 01/24/2017 CLINICAL DATA:  Recent surgery for ruptured appendix, JP drain in place, worsening pain, increase WV PC EXAM: CT ABDOMEN AND PELVIS WITH CONTRAST TECHNIQUE: Multidetector CT imaging of the abdomen and pelvis was performed using the standard protocol following bolus administration of intravenous contrast. CONTRAST:  140m ISOVUE-300 IOPAMIDOL (ISOVUE-300) INJECTION 61% COMPARISON:  01/13/2017 FINDINGS: Lower chest: Lung bases shows no acute findings. Hepatobiliary: No focal liver abnormality is seen. No gallstones, gallbladder wall thickening, or biliary dilatation. Pancreas: Unremarkable. No pancreatic ductal  dilatation or surrounding inflammatory changes. Spleen: Normal in size without focal abnormality. Adrenals/Urinary Tract: No adrenal gland mass. There is mild right hydronephrosis and mild right hydroureter. Delayed renal images shows mild delay excretion of the right ureter. Stomach/Bowel: No small bowel obstruction. Oral contrast material was given to the patient. There is a left lower anterior wall JP drain Crossing in right lower quadrant with tip in right flank just inferior to inferior aspect of the liver. A second suprapubic JP drain is noted coiling in anterior pelvis with tip in right pelvis anteriorly. At the site of previous inflammatory process just medial to the cecum and lateral/above terminal ileum there is a recurrent fluid collection with enhancing wall measures at least 7 by 5.5 cm. Again noted a calcified appendicolith lateral aspect of this collection axial image 47. Findings are consistent with recurrent pelvic abscess post perforated appendicitis. Please note the tip of the drain is not within this collection. 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 There is mass effect or probable involvement of the right ureter concerning for causing right hydroureter. Axial image 62 the abscess measures 7.2 x 5.7 cm. Moderate stool noted within transverse colon probable ileus. There is some colonic stool and gas noted within distal sigmoid colon. Moderate gas noted within rectum. Vascular/Lymphatic: No aortic aneurysm. There are central mesenteric lymph nodes probable reactive the largest axial image 39 measures 1.2 cm. Reproductive: Prostate gland is unremarkable. Other: No definite evidence of urinary bladder involvement. No evidence of contrast extravasation to suggest communication with the pelvic abscess. Small foci of free air are noted adjacent to the tip of the drainage catheter in right flank axial image 41 probable post  instrumentation. Musculoskeletal:  No destructive bony lesions are noted. Sagittal images of the spine are unremarkable. IMPRESSION: 1. Findings consistent with recurrent right pelvic abscess which is irregular with enhancing wall measures at least 7.2 by 5.7 cm. Recurrent abscess post perforated appendicitis. No evidence of contrast extravasation or communication with small bowel. 2. There are 2 pelvic drainage catheters as described above. Please note the abscess is separated from the catheters. 3. There is mass effect or involvement of the right ureter causing mild delay excretion of the right ureter. Mild right hydronephrosis. 4. There is thickening of terminal ileal wall just anterior and inferior to the abscess consistent with satellite enteritis. Electronically Signed   By: Lahoma Crocker M.D.   On: 01/24/2017 17:14   Ct Abdomen Pelvis W Contrast  Result Date: 01/13/2017 CLINICAL DATA:  Generalized abdominal pain for 3 weeks worse in morning, has been having nausea, vomiting and diarrhea EXAM: CT ABDOMEN AND PELVIS WITH CONTRAST TECHNIQUE: Multidetector CT imaging of the abdomen and pelvis was performed using the standard protocol following bolus administration of intravenous contrast. Sagittal and coronal MPR images reconstructed from axial data set. CONTRAST:  131m ISOVUE-300 IOPAMIDOL (ISOVUE-300) INJECTION 61% COMPARISON:  Dilute oral contrast.  100 cc Isovue-300 IV. FINDINGS: Lower chest: None Hepatobiliary: Lung bases clear Pancreas: Gallbladder and liver normal appearance Spleen: Normal appearance Adrenals/Urinary Tract: Normal appearance with probable splenule medial to spleen Stomach/Bowel: Adrenal glands, kidneys, and ureters normal appearance. Bladder decompressed, wall prominent question artifact. Vascular/Lymphatic: Focal inflammatory process identified in the RIGHT pelvis adjacent to the tip of the cecum. Potential appendiculolith is noted in the RIGHT pelvis image 56 though. Significant wall  thickening of the cecal tip and terminal ileum. Large associated gas and fluid collection consistent with abscess 7.0 x 4.3 x 6.8 cm. Surrounding inflammatory changes and adjacent reactive adenopathy. Findings are most suspicious for perforated appendicitis with abscess formation, less likely a primary small bowel process though not completely excluded. Reproductive: No evidence of bowel obstruction. Stomach and remaining bowel loops unremarkable. Other: Small amount of free fluid in pelvis. No free air. No hernia. Musculoskeletal: Unremarkable IMPRESSION: Large inflammatory process identified in the RIGHT pelvis adjacent to the cecum and terminal ileum most likely representing perforated appendicitis with secondary inflammatory changes and a large periappendiceal abscess 7.0 x 4.3 x 6.8 cm. Findings called to Dr. GArchie Balboaon 01/13/2017 at 1745 hours. Electronically Signed   By: MLavonia DanaM.D.   On: 01/13/2017 17:47   Ct Image Guided Drainage Percut Cath  Peritoneal Retroperit  Result Date: 01/25/2017 INDICATION: Postop abscess, perforated appendicitis EXAM: CT-GUIDED RIGHT LOWER QUADRANT POSTOP ABSCESS DRAIN INSERTION MEDICATIONS: The patient is currently admitted to the hospital and receiving intravenous antibiotics. The antibiotics were administered within an appropriate time frame prior to the initiation of the procedure. ANESTHESIA/SEDATION: Fentanyl 100 mcg IV; Versed 3.0 mg IV Moderate Sedation Time:  17 MINUTES The patient was continuously monitored during the procedure by the interventional radiology nurse under my direct supervision. COMPLICATIONS: None immediate. PROCEDURE: Informed written consent was obtained from the patient after a thorough discussion of the procedural risks, benefits and alternatives. All questions were addressed. Maximal Sterile Barrier Technique was utilized including caps, mask, sterile gowns, sterile gloves, sterile drape, hand hygiene and skin antiseptic. A timeout was  performed prior to the initiation of the procedure. previous imaging reviewed. patient positioned supine. initial noncontrast localization ct performed. the deep right lower quadrant pelvic abscess was difficult to visualize without contrast. 100 cc contrast administered IV. repeat imaging performed  of the lower abdomen pelvis. the complex loculated abscess in the right lower pelvis was localized. anterior percutaneous approach marked. Under sterile conditions and local anesthesia, an 18 gauge 10 cm access needle was advanced from a right lower quadrant anterior approach into the fluid collection. Needle position confirmed with CT. Syringe aspiration yielded purulent bloody fluid. Guidewire inserted followed by tract dilatation to advance a 10 [place] drain. Drain catheter confirmed in the deep portion of the fluid collection. Syringe aspiration yielded 50 cc exudative fluid. Sample sent for Gram stain and culture. Catheter secured with a Prolene suture and connected to external suction bulb. No immediate complication. Patient tolerated the procedure well. IMPRESSION: Successful CT-guided right lower quadrant abscess drain insertion. Electronically Signed   By: Jerilynn Mages.  Shick M.D.   On: 01/25/2017 12:26    Assessment:   DEWEL LOTTER is a 21 y.o. male nitially admitted 4/4 with several days abd pain and found to have ruptured appendicitis. Underwent surgery and was dced 4/10 on oral augmentin with JP drains in place. Readmitted 4.15 with recurrent pain and fever and found to have elevated WBC and CT showed a pelvic abscess. Underwent IR placement of drain on 4/16 with cxs growing ESBL E coli.  He has been afebrile and his WBC is down to 11.4. We discussed picc placement and IV ertapenem vs oral bactrim and augmentin.  Recommendations Given his marked improvement and drains in place I think he would be best treated with oral bactrim to cover the ESBL E coli, as well as oral augmentin as intraabdominal  infections are usually polymicrobial  Would rec continue abx until all drains are removed and for 7-10 days following removal. I can see if needed as otpt or he can be managed by surgery.  Thank you very much for allowing me to participate in the care of this patient. Please call with questions.   Cheral Marker. Ola Spurr, MD

## 2017-01-29 LAB — CBC WITH DIFFERENTIAL/PLATELET
BASOS PCT: 2 %
Basophils Absolute: 0.2 10*3/uL — ABNORMAL HIGH (ref 0–0.1)
EOS ABS: 0.6 10*3/uL (ref 0–0.7)
EOS PCT: 6 %
HCT: 30.8 % — ABNORMAL LOW (ref 40.0–52.0)
Hemoglobin: 10.3 g/dL — ABNORMAL LOW (ref 13.0–18.0)
LYMPHS ABS: 1.9 10*3/uL (ref 1.0–3.6)
Lymphocytes Relative: 20 %
MCH: 28.6 pg (ref 26.0–34.0)
MCHC: 33.6 g/dL (ref 32.0–36.0)
MCV: 85.2 fL (ref 80.0–100.0)
MONO ABS: 0.7 10*3/uL (ref 0.2–1.0)
MONOS PCT: 8 %
Neutro Abs: 6.2 10*3/uL (ref 1.4–6.5)
Neutrophils Relative %: 64 %
PLATELETS: 429 10*3/uL (ref 150–440)
RBC: 3.62 MIL/uL — ABNORMAL LOW (ref 4.40–5.90)
RDW: 13.5 % (ref 11.5–14.5)
WBC: 9.5 10*3/uL (ref 3.8–10.6)

## 2017-01-29 LAB — BASIC METABOLIC PANEL
Anion gap: 6 (ref 5–15)
BUN: <5 mg/dL — ABNORMAL LOW (ref 6–20)
CALCIUM: 8 mg/dL — AB (ref 8.9–10.3)
CHLORIDE: 107 mmol/L (ref 101–111)
CO2: 25 mmol/L (ref 22–32)
CREATININE: 0.71 mg/dL (ref 0.61–1.24)
GFR calc non Af Amer: >60 mL/min (ref >60)
Glucose, Bld: 97 mg/dL (ref 65–99)
Potassium: 3.1 mmol/L — ABNORMAL LOW (ref 3.5–5.1)
SODIUM: 138 mmol/L (ref 135–145)

## 2017-01-29 LAB — AEROBIC/ANAEROBIC CULTURE W GRAM STAIN (SURGICAL/DEEP WOUND): Special Requests: NORMAL

## 2017-01-29 LAB — AEROBIC/ANAEROBIC CULTURE (SURGICAL/DEEP WOUND)

## 2017-01-29 LAB — MAGNESIUM: MAGNESIUM: 1.7 mg/dL (ref 1.7–2.4)

## 2017-01-29 MED ORDER — MAGNESIUM SULFATE 2 GM/50ML IV SOLN
2.0000 g | Freq: Once | INTRAVENOUS | Status: AC
  Administered 2017-01-29: 2 g via INTRAVENOUS
  Filled 2017-01-29: qty 50

## 2017-01-29 MED ORDER — HYDROCODONE-ACETAMINOPHEN 5-325 MG PO TABS: 1.0000 | ORAL_TABLET | ORAL | 0 refills | Status: DC | PRN

## 2017-01-29 MED ORDER — AMOXICILLIN-POT CLAVULANATE 875-125 MG PO TABS: 1.0000 | ORAL_TABLET | Freq: Two times a day (BID) | ORAL | 0 refills | Status: DC

## 2017-01-29 MED ORDER — POTASSIUM CHLORIDE CRYS ER 20 MEQ PO TBCR
40.0000 meq | EXTENDED_RELEASE_TABLET | ORAL | Status: DC
  Administered 2017-01-29: 40 meq via ORAL
  Filled 2017-01-29: qty 2

## 2017-01-29 MED ORDER — SULFAMETHOXAZOLE-TRIMETHOPRIM 800-160 MG PO TABS: 1.0000 | ORAL_TABLET | Freq: Two times a day (BID) | ORAL | 0 refills | Status: AC

## 2017-01-29 MED ORDER — PERMETHRIN 1 % EX LOTN: 1.0000 "application " | TOPICAL_LOTION | CUTANEOUS | 1 refills | Status: AC

## 2017-01-29 NOTE — Discharge Summary (Signed)
Patient ID: Jimmy Leach MRN: [id number] DOB/AGE: 04-Jul-1996 21 y.o.  Admit date: 01/24/2017 Discharge date: 01/29/2017   Discharge Diagnoses:  Active Problems:   Postprocedural intraabdominal abscess   Procedures:  Percutaneous drainage of intra-abdominal abscess  Hospital Course: Patient was admitted on 4/15 with worsening abdominal pain and recurrent abscess after laparoscopic pelvic abscess drainage due to ruptured appendicitis.  He underwent drainage with interventional radiology on 4/16.  His cultures resulted in ESBL E Coli.  He was placed under contact precautions for this.  He was also having bouts of diarrhea and tested negative for C difficile.  His diet was slowly advanced and was continued on IV antibiotics until his WBC normalized, at which time he was changed to oral Augmentin and Bactrim per ID recommendations.  He also had lice on his hair and received treatment with permethrin.  On 4/20, he was tolerating a regular diet, had better bowel function, his pain was well controlled, and was deemed ready for discharge.  He currently has 3 drains in place.  On exam, he was in no acute distress with stable vital signs.  His abdomen was soft, non-distended, and non-tender to palpation.  His drains had serosanguinous fluid.  Consults:  Infectious Disease team  Disposition: 01-Home or Self Care  Discharge Instructions    Call MD for:  difficulty breathing, headache or visual disturbances    Complete by:  As directed    Call MD for:  persistant nausea and vomiting    Complete by:  As directed    Call MD for:  redness, tenderness, or signs of infection (pain, swelling, redness, odor or green/yellow discharge around incision site)    Complete by:  As directed    Call MD for:  severe uncontrolled pain    Complete by:  As directed    Call MD for:  temperature >100.4    Complete by:  As directed    Diet - low sodium heart healthy    Complete by:  As directed    Discharge  instructions    Complete by:  As directed    1.  Flush right sided drain twice daily with 5 ml of normal saline 2.  Record output from each drain individually on daily basis.  Bring record of output with you to office. 3.  Continue taking antibiotics as indicated.  It is very important that you adhere to your antibiotic regimen. 4.  Patient may shower, but do not submerge the drains or incisions in pool/tub.   Driving Restrictions    Complete by:  As directed    Do not drive if taking narcotics for pain control.   Increase activity slowly    Complete by:  As directed    Lifting restrictions    Complete by:  As directed    No heavy lifting or pushing of more than 10-15 lbs until 02/11/17.  Then may resume regular activities.     Allergies as of 01/29/2017   No Known Allergies     Medication List    TAKE these medications   amoxicillin-clavulanate 875-125 MG tablet Commonly known as:  AUGMENTIN Take 1 tablet by mouth every 12 (twelve) hours. What changed:  when to take this   HYDROcodone-acetaminophen 5-325 MG tablet Commonly known as:  NORCO/VICODIN Take 1-2 tablets by mouth every 4 (four) hours as needed for moderate pain or severe pain.   permethrin 1 % lotion Commonly known as:  PERMETHRIN LICE TREATMENT Apply 1 application topically once a  week. Shampoo, rinse and towel dry hair, saturate hair and scalp with permethrin. Rinse after 10 min; repeat in 1 week if needed   sulfamethoxazole-trimethoprim 800-160 MG tablet Commonly known as:  BACTRIM DS,SEPTRA DS Take 1 tablet by mouth every 12 (twelve) hours.      Follow-up Information    Ricarda Frame, MD Follow up on 02/01/2017.   Specialty:  General Surgery Why:  Arrive at 9:15 am for appointment. Contact information: [address] [place] [address] [phone]

## 2017-01-29 NOTE — Care Management (Signed)
Patient discharged today with the addition of Septra.  $10 out of pocket cost at [employer].  No coupon needed

## 2017-01-29 NOTE — Progress Notes (Signed)
Patient discharge teaching given, including activity, diet, follow-up appoints, and medications. Patient verbalized understanding of all discharge instructions. Pt and mother was taught on how to flush JP line that needs to be done BID, pt and mother demonstrated with teach back. Pt was sent home with 10 ml syringes, enough to cover him until follow-up appointment on Monday. IV access was d/c'd. Vitals are stable. Skin is intact except as charted in most recent assessments. Pt to be escorted out by volunteer, to be driven home by family.  Shizuko Wojdyla [employer]

## 2017-02-01 ENCOUNTER — Ambulatory Visit (INDEPENDENT_AMBULATORY_CARE_PROVIDER_SITE_OTHER): Payer: Self-pay | Admitting: General Surgery

## 2017-02-01 ENCOUNTER — Encounter: Payer: Self-pay | Admitting: General Surgery

## 2017-02-01 VITALS — BP 113/78 | HR 92 | Temp 98.4°F | Ht 67.0 in | Wt 143.0 lb

## 2017-02-01 DIAGNOSIS — Z4889 Encounter for other specified surgical aftercare: Secondary | ICD-10-CM

## 2017-02-01 MED ORDER — HYDROCODONE-ACETAMINOPHEN 5-325 MG PO TABS: 1.0000 | ORAL_TABLET | Freq: Four times a day (QID) | ORAL | 0 refills | Status: DC | PRN

## 2017-02-01 NOTE — Progress Notes (Signed)
Outpatient Surgical Follow Up  02/01/2017  Jimmy Leach is an 21 y.o. male.   Chief Complaint  Patient presents with  . Routine Post Op    Laparoscopic drainage of pelvic abscess (01/14/17 Dr. Tonita Cong)    HPI: 21 year old male returns to clinic for follow-up from a pelvic abscess drainage secondary to ruptured appendicitis. He reports eating well and having normal bowel function. Continues to have some pain at the surgical sites as well as the drain sites. The drain output has become clear and clear. A total of 40 mL of output over the last 24 hours. He is otherwise doing well and is hoping to have his drains removed and sent as possible. He was diagnosed with lice in the hospital and is still on the licetreatment. He denies any fevers, chills, nausea, vomiting chest pain, shortness of breath, diarrhea, constipation.  Past Medical History:  Diagnosis Date  . ADHD     Past Surgical History:  Procedure Laterality Date  . LAPAROSCOPIC APPENDECTOMY N/A 01/14/2017   Procedure: LAPAROSCOPIC DRAINAGE OF PELVIC ABSCESS;  Surgeon: Ricarda Frame, MD;  Location: [facility] ORS;  Service: General;  Laterality: N/A;    No family history on file.  Social History:  reports that he has never smoked. He has never used smokeless tobacco. He reports that he drinks alcohol. He reports that he uses drugs, including Marijuana and Other-see comments.  Allergies: No Known Allergies  Medications reviewed.    ROS Multipoint review of systems was completed, all pertinent positives and negatives are documented within the history of present illness remainder negative   BP 113/78   Pulse 92   Temp 98.4 F (36.9 C) (Oral)   Ht  (1.702 m)   Wt 64.9 kg (143 lb)   BMI 22.40 kg/m   Physical Exam Gen.: No acute distress Chest: Clear to auscultation Heart: Regular rate and rhythm Abdomen: Soft, minimally tender at the drain sites, nondistended. 3 drains remain in place all with a serous output. No  evidence of spreading erythema, purulence, peritonitis    No results found for this or any previous visit (from the past 48 hour(s)). No results found.  Assessment/Plan:  1. Aftercare following surgery 21 year old male with multiple drains in place from a pelvic abscess secondary to ruptured appendicitis. The lateral JP drain was removed in clinic without difficulty. It had approximately less than 5 mL's of output over the last 24 hours. Plan to provide with a refill of pain medication today. He will follow-up in clinic on Thursday for additional evaluation and possible removal of an additional drain. All questions answered to patient's satisfaction.     Ricarda Frame, MD FACS General Surgeon  02/01/2017,10:07 AM

## 2017-02-01 NOTE — Patient Instructions (Signed)
Continue taking your antibiotics daily.  Log the drainage separately each time. Change dressing on wound where drain was pulled daily make sure to keep it dry.  We will see you back in office. See you appointment below.

## 2017-02-04 ENCOUNTER — Ambulatory Visit (INDEPENDENT_AMBULATORY_CARE_PROVIDER_SITE_OTHER): Payer: Self-pay | Admitting: General Surgery

## 2017-02-04 ENCOUNTER — Encounter: Payer: Self-pay | Admitting: General Surgery

## 2017-02-04 VITALS — BP 111/78 | HR 87 | Temp 98.3°F | Ht 67.0 in | Wt 139.8 lb

## 2017-02-04 DIAGNOSIS — Z4889 Encounter for other specified surgical aftercare: Secondary | ICD-10-CM

## 2017-02-04 DIAGNOSIS — K37 Unspecified appendicitis: Secondary | ICD-10-CM | POA: Insufficient documentation

## 2017-02-04 NOTE — Progress Notes (Signed)
Outpatient Surgical Follow Up  02/04/2017  Jimmy Leach is an 21 y.o. male.   Chief Complaint  Patient presents with  . Routine Post Op    Laparoscopic Drainage of Pelvic Abscess (01/14/17)- Dr. Tonita Cong    HPI: 21 year old male returns to clinic for follow-up of pelvic abscess drainage. He was seen earlier this week for the first of his 3 drains removed. He is A long since that time showing decreasing output from both drains. He states he's having minimal pain and only takes a pain pill when needed. He is still taking his antibiotics as prescribed. He denies any fevers, chills, nausea, vomiting, chest pain, shortness breath, diarrhea, constipation.  Past Medical History:  Diagnosis Date  . ADHD   . Appendicitis     Past Surgical History:  Procedure Laterality Date  . LAPAROSCOPIC APPENDECTOMY N/A 01/14/2017   Procedure: LAPAROSCOPIC DRAINAGE OF PELVIC ABSCESS;  Surgeon: Ricarda Frame, MD;  Location: [facility] ORS;  Service: General;  Laterality: N/A;    Family History  Problem Relation Age of Onset  . Healthy Mother     Social History:  reports that he has never smoked. He has never used smokeless tobacco. He reports that he drinks alcohol. He reports that he uses drugs, including Marijuana and Other-see comments.  Allergies: No Known Allergies  Medications reviewed.    ROS  A multipoint review of systems was completed, all pertinent positives and negatives are documented within the history of present illness and remainder are negative   BP 111/78   Pulse 87   Temp 98.3 F (36.8 C) (Oral)   Ht  (1.702 m)   Wt 63.4 kg (139 lb 12.8 oz)   BMI 21.90 kg/m   Physical Exam Gen.: No acute distress Chest: Clear to auscultation Heart: Regular rhythm Abdomen: Soft, nontender, nondistended. Left lateral incision site well approximated without erythema or drainage. Low midline JP drain with a serous sanguinous drainage, IR placed drain and also with serous and was  output.    No results found for this or any previous visit (from the past 48 hour(s)). No results found.  Assessment/Plan:  1. Aftercare following surgery 21 year old male status post drainage of pelvic abscess. Last remaining surgical drain removed today without difficulty. IR placed drain remains. Patient again counseled to keep a log of the output. He'll follow-up in clinic on Monday for an additional evaluation. If it remains serosanguineous and decreases in output it may just be removed. Otherwise discussed that a CT scan may be ordered prior to its removal if deemed necessary. Patient voiced understanding. He will continue to take his antibiotics.     Ricarda Frame, MD FACS General Surgeon  02/04/2017,1:53 PM

## 2017-02-04 NOTE — Patient Instructions (Signed)
You may shower with soap and water. But, just make sure that the area is dried thoroughly after the shower and apply a new dressing. Keep dressing on until drainage stops. This typically takes 3-4 days.  Please follow-up next week as scheduled below.

## 2017-02-08 ENCOUNTER — Ambulatory Visit (INDEPENDENT_AMBULATORY_CARE_PROVIDER_SITE_OTHER): Payer: Self-pay | Admitting: Surgery

## 2017-02-08 DIAGNOSIS — Z4889 Encounter for other specified surgical aftercare: Secondary | ICD-10-CM

## 2017-02-08 MED ORDER — AMOXICILLIN-POT CLAVULANATE 875-125 MG PO TABS: 1.0000 | ORAL_TABLET | Freq: Two times a day (BID) | ORAL | 0 refills | Status: AC

## 2017-02-08 MED ORDER — HYDROCODONE-ACETAMINOPHEN 5-325 MG PO TABS: 1.0000 | ORAL_TABLET | Freq: Four times a day (QID) | ORAL | 0 refills | Status: DC | PRN

## 2017-02-08 NOTE — Progress Notes (Signed)
Outpatient postop visit  02/08/2017  Jimmy Leach is an 21 y.o. male.    Procedure: Drainage of appendiceal abscess  CC: Drain in place  HPI: Patient here for removal of his last remaining drain which is a CT-guided pigtail drain. He is eating well having no nausea or vomiting and no fevers or chills he is on Augmentin. Drainage is less than 1 ounce in the last 48 hours. Serous in nature  Medications reviewed.    Physical Exam:  There were no vitals taken for this visit.    PE: Serous fluid present. Soft nontender abdomen no erythema no drainage no mass    Assessment/Plan:  Drain is removed without difficulty. We'll refill his Augmentin for another 10 days and refill his Vicodin on a when necessary basis. He will follow-up next week with Dr. Karleen Dolphin, MD, FACS

## 2017-02-08 NOTE — Patient Instructions (Addendum)
Place a dry dressing over wound for the next 4-5 days or until drainage has stopped.  We will see you in office as scheduled below.

## 2017-02-24 ENCOUNTER — Encounter: Payer: Self-pay | Admitting: General Surgery

## 2017-02-26 ENCOUNTER — Telehealth: Payer: Self-pay

## 2017-02-26 NOTE — Telephone Encounter (Signed)
Spoke with patient at this time. Patient states that he is doing well and is unaware that he missed an appointment with our office this week. He has been running 2.5 miles daily and working out. He states that he feels great! He was rescheduled for another office appointment with Dr. Tonita CongWoodham to discuss elective appendectomy.

## 2017-03-09 ENCOUNTER — Emergency Department
Admission: EM | Admit: 2017-03-09 | Discharge: 2017-03-09 | Disposition: A | Discharge status: Home / Self Care | Payer: Self-pay | Attending: Emergency Medicine | Admitting: Emergency Medicine

## 2017-03-09 ENCOUNTER — Emergency Department: Payer: Self-pay

## 2017-03-09 ENCOUNTER — Encounter: Payer: Self-pay | Admitting: Emergency Medicine

## 2017-03-09 DIAGNOSIS — F909 Attention-deficit hyperactivity disorder, unspecified type: Secondary | ICD-10-CM | POA: Insufficient documentation

## 2017-03-09 DIAGNOSIS — R1031 Right lower quadrant pain: Secondary | ICD-10-CM | POA: Insufficient documentation

## 2017-03-09 DIAGNOSIS — R1030 Lower abdominal pain, unspecified: Secondary | ICD-10-CM

## 2017-03-09 DIAGNOSIS — R1032 Left lower quadrant pain: Secondary | ICD-10-CM | POA: Insufficient documentation

## 2017-03-09 HISTORY — DX: Acute appendicitis with perforation and localized peritonitis, with abscess: K35.33

## 2017-03-09 LAB — URINALYSIS, COMPLETE (UACMP) WITH MICROSCOPIC
BILIRUBIN URINE: NEGATIVE
Bacteria, UA: NONE SEEN
GLUCOSE, UA: NEGATIVE mg/dL
Hgb urine dipstick: NEGATIVE
KETONES UR: NEGATIVE mg/dL
Leukocytes, UA: NEGATIVE
Nitrite: NEGATIVE
PROTEIN: NEGATIVE mg/dL
SQUAMOUS EPITHELIAL / LPF: NONE SEEN
Specific Gravity, Urine: 1.013 (ref 1.005–1.030)
pH: 7 (ref 5.0–8.0)

## 2017-03-09 LAB — CBC
HCT: 41.3 % (ref 40.0–52.0)
HEMOGLOBIN: 14 g/dL (ref 13.0–18.0)
MCH: 29 pg (ref 26.0–34.0)
MCHC: 33.9 g/dL (ref 32.0–36.0)
MCV: 85.6 fL (ref 80.0–100.0)
Platelets: 246 10*3/uL (ref 150–440)
RBC: 4.82 MIL/uL (ref 4.40–5.90)
RDW: 15.5 % — ABNORMAL HIGH (ref 11.5–14.5)
WBC: 4.7 10*3/uL (ref 3.8–10.6)

## 2017-03-09 LAB — COMPREHENSIVE METABOLIC PANEL
ALT: 18 U/L (ref 17–63)
ANION GAP: 7 (ref 5–15)
AST: 34 U/L (ref 15–41)
Albumin: 4.5 g/dL (ref 3.5–5.0)
Alkaline Phosphatase: 64 U/L (ref 38–126)
BUN: 11 mg/dL (ref 6–20)
CHLORIDE: 106 mmol/L (ref 101–111)
CO2: 24 mmol/L (ref 22–32)
Calcium: 9.8 mg/dL (ref 8.9–10.3)
Creatinine, Ser: 0.8 mg/dL (ref 0.61–1.24)
GFR calc non Af Amer: >60 mL/min (ref >60)
Glucose, Bld: 131 mg/dL — ABNORMAL HIGH (ref 65–99)
Potassium: 3.9 mmol/L (ref 3.5–5.1)
SODIUM: 137 mmol/L (ref 135–145)
Total Bilirubin: 1 mg/dL (ref 0.3–1.2)
Total Protein: 7.9 g/dL (ref 6.5–8.1)

## 2017-03-09 LAB — LIPASE, BLOOD: Lipase: 27 U/L (ref 11–51)

## 2017-03-09 MED ORDER — IOPAMIDOL (ISOVUE-300) INJECTION 61%
15.0000 mL | INTRAVENOUS | Status: AC
  Administered 2017-03-09: 30 mL via ORAL
  Filled 2017-03-09 (×2): qty 15

## 2017-03-09 MED ORDER — IOPAMIDOL (ISOVUE-300) INJECTION 61%
100.0000 mL | Freq: Once | INTRAVENOUS | Status: AC | PRN
  Administered 2017-03-09: 100 mL via INTRAVENOUS
  Filled 2017-03-09: qty 100

## 2017-03-09 NOTE — Consult Note (Signed)
Patient ID: Jimmy Leach, male   DOB: 01/19/1996, 21 y.o.   MRN: [id number]  HPI Jimmy Leach is a 21 y.o. male asked to see in consultation by Dr. York CeriseForbach  and I have personally d/w him the situation. Jimmy Leach is well known to our service with history 6 weeks ago for perforated appendicitis requiring placement of drains for scopic 8. At that time Dr. Tonita CongWoodham was unable to find the appendix. He is in the hospital for a few days with IV antibiotics and was sent home on by mouth antibiotics and did very well his drains were removed and he was unfortunately lost follow-up. He states that over the last few weeks she he has been having intermittent shooting pains to the left posterior surface abdominal wall near his incision sites, no fevers or chills he's been eating well and he is hungry. Currently he is pain free. Pain is and mild to moderate intensity and does not radiate. No specific alleviating or aggravating factors. He had a CT scan that I have personally reviewed showing evidence of an appendicolith and an appendix that had significant improvement and significant good changes in the pelvis. He is an phlegmon process and inflammatory process have subsided. There is no new interval abdominal collections and there is really no particular strong evidence of appendicitis by CT. There is no free air and there is no evidence of perforation. His white count and his creatinine is normal   HPI  Past Medical History:  Diagnosis Date  . Acute appendicitis with appendiceal abscess 01/2017   required bilateral percutaneous drains  . ADHD   . Appendicitis     Past Surgical History:  Procedure Laterality Date  . LAPAROSCOPIC APPENDECTOMY N/A 01/14/2017   Procedure: LAPAROSCOPIC DRAINAGE OF PELVIC ABSCESS;  Surgeon: Ricarda Frameharles Woodham, MD;  Location: [facility] ORS;  Service: General;  Laterality: N/A;    Family History  Problem Relation Age of Onset  . Healthy Mother     Social History Social History   Substance Use Topics  . Smoking status: Never Smoker  . Smokeless tobacco: Never Used  . Alcohol use Yes     Comment: Occasional    No Known Allergies  No current facility-administered medications for this encounter.    Current Outpatient Prescriptions  Medication Sig Dispense Refill  . HYDROcodone-acetaminophen (NORCO/VICODIN) 5-325 MG tablet Take 1 tablet by mouth every 6 (six) hours as needed for moderate pain or severe pain. 20 tablet 0     Review of Systems Full ROS  was asked and was negative except for the information on the HPI  Physical Exam Blood pressure (!) 136/93, pulse 60, temperature 98.4 F (36.9 C), temperature source Oral, resp. rate 16, height 5\' 7"  (1.702 m), weight 68 kg (150 lb), SpO2 98 %. CONSTITUTIONAL: NAD EYES: Pupils are equal, round, and reactive to light, Sclera are non-icteric. EARS, NOSE, MOUTH AND THROAT: The oropharynx is clear. The oral mucosa is pink and moist. Hearing is intact to voice. LYMPH NODES:  Lymph nodes in the neck are normal. RESPIRATORY:  Lungs are clear. There is normal respiratory effort, with equal breath sounds bilaterally, and without pathologic use of accessory muscles. CARDIOVASCULAR: Heart is regular without murmurs, gallops, or rubs. GI: The abdomen is soft, nontender, and nondistended. There are no palpable masses. There is no hepatosplenomegaly. There are normal bowel sounds in all quadrants. Incisions healed, no infection No rebound or peritonitis GU: Rectal deferred.   MUSCULOSKELETAL: Normal muscle strength and tone. No cyanosis  or edema.   SKIN: Turgor is good and there are no pathologic skin lesions or ulcers. NEUROLOGIC: Motor and sensation is grossly normal. Cranial nerves are grossly intact. PSYCH:  Oriented to person, place and time. Affect is normal.  Data Reviewed  I have personally reviewed the patient's imaging, laboratory findings and medical records.    Assessment/Plan Abdominal pain Left side, ?  Post incisional pain. Currently pain free and no evidence of peritonitis. No clinical evidence of appendicitis. Discussed with the patient in detail about my thought process. clinically there is no evidence of appendicitis. I I gave him the option to   get him admitted and perform an elective laparoscopic appendectomy tomorrow . At this time he wishes to go home. He states that he's got a new job and he is unable and unwilling to stay for scheduled appendectomy. I encouraged him to follow up with asked neck suite with Dr. Tonita Cong in the office. He states that he will come back to see [place] in the outpatient setting.    Sterling Big, MD FACS General Surgeon 03/09/2017, 6:20 PM

## 2017-03-09 NOTE — ED Provider Notes (Signed)
[facility] Emergency Department Provider Note  ____________________________________________   First MD Initiated Contact with Patient 03/09/17 1406     (approximate)  I have reviewed the triage vital signs and the nursing notes.   HISTORY  Chief Complaint Abdominal Pain    HPI Jimmy Leach is a 21 y.o. male with a recent complicated medical history due to perforated appendicitis with appendiceal abscess requiring bilateral abdominal JP drains.  His drains were removed about 5-6 weeks ago and he has been exercising and improving rapidly.  He has plans for a clinic visit with Dr. Tonita Cong to discuss appendectomy.  However he has had about 1 day of acute onset abdominal pain in both the right lower and left lower quadrants of his abdomen.  He describes the pain as sharp and aching and located at the sites of his JP drains.  Lifting and exercise makes it worse and rest makes it better.  It is moderate at its worst but it makes him nervous because of the severity of his recent illness and infection.  He has not had any nausea nor vomiting and denies diarrhea.  He has not had any respiratory problems until today and he had a very severe coughing episode during which he had some sputum tinged with blood, but he has not consistently short of breath.  He had some sharp chest pain at the time of his coughing episode but that has completely resolved and he is in no distress currently.  He denies recent fever/chills, night sweats, dysuria.   Past Medical History:  Diagnosis Date  . Acute appendicitis with appendiceal abscess 01/2017   required bilateral percutaneous drains  . ADHD   . Appendicitis     Patient Active Problem List   Diagnosis Date Noted  . Appendicitis   . Postprocedural intraabdominal abscess 01/24/2017  . Ruptured appendicitis   . Polysubstance abuse 04/20/2014    Past Surgical History:  Procedure Laterality Date  . LAPAROSCOPIC APPENDECTOMY  N/A 01/14/2017   Procedure: LAPAROSCOPIC DRAINAGE OF PELVIC ABSCESS;  Surgeon: Ricarda Frame, MD;  Location: [facility] ORS;  Service: General;  Laterality: N/A;    Prior to Admission medications   Medication Sig Start Date End Date Taking? Authorizing Provider  HYDROcodone-acetaminophen (NORCO/VICODIN) 5-325 MG tablet Take 1 tablet by mouth every 6 (six) hours as needed for moderate pain or severe pain. 02/08/17   Lattie Haw, MD    Allergies Patient has no known allergies.  Family History  Problem Relation Age of Onset  . Healthy Mother     Social History Social History  Substance Use Topics  . Smoking status: Never Smoker  . Smokeless tobacco: Never Used  . Alcohol use Yes     Comment: Occasional    Review of Systems Constitutional: No fever/chills Eyes: No visual changes. ENT: No sore throat. Cardiovascular: Brief episode of central chest pain during coughing episodes, now resolved Respiratory: Denies shortness of breath.  Severe coughing episode earlier today with blood-tinged sputum, completely resolved Gastrointestinal: Acute onset left and right lower quadrant abdominal pain at the site of his prior drains.  Denies nausea, vomiting, diarrhea. Genitourinary: Negative for dysuria. Musculoskeletal: Negative for neck pain.  Negative for back pain. Integumentary: Negative for rash. Neurological: Negative for headaches, focal weakness or numbness.   ____________________________________________   PHYSICAL EXAM:  VITAL SIGNS: ED Triage Vitals  Enc Vitals Group     BP 03/09/17 1123 133/78     Pulse Rate 03/09/17 1123 64  Resp 03/09/17 1123 18     Temp 03/09/17 1123 98.4 F (36.9 C)     Temp Source 03/09/17 1123 Oral     SpO2 03/09/17 1123 98 %     Weight 03/09/17 1124 68 kg (150 lb)     Height 03/09/17 1124 1.702 m (5\' 7" )     Head Circumference --      Peak Flow --      Pain Score 03/09/17 1123 5     Pain Loc --      Pain Edu? --      Excl. in GC? --      Constitutional: Alert and oriented. Well appearing and in no acute distress. Eyes: Conjunctivae are normal.  Head: Atraumatic. Nose: No congestion/rhinnorhea. Mouth/Throat: Mucous membranes are moist. Neck: No stridor.  No meningeal signs.   Cardiovascular: Normal rate, regular rhythm. Good peripheral circulation. Grossly normal heart sounds. Respiratory: Normal respiratory effort.  No retractions. Lungs CTAB. Gastrointestinal: Soft with muscular body habitus.  Moderate tenderness to palpation with some guarding in his right lower quadrant.  No tenderness to palpation of the left lower quadrant.  No rebound.  Well appearing and well healing wounds from the JP drain placement with no obvious localized infection. Musculoskeletal: No lower extremity tenderness nor edema. No gross deformities of extremities. Neurologic:  Normal speech and language. No gross focal neurologic deficits are appreciated.  Skin:  Skin is warm, dry and intact. No rash noted. Psychiatric: Mood and affect are normal. Speech and behavior are normal.  ____________________________________________   LABS (all labs ordered are listed, but only abnormal results are displayed)  Labs Reviewed  COMPREHENSIVE METABOLIC PANEL - Abnormal; Notable for the following:       Result Value   Glucose, Bld 131 (*)    All other components within normal limits  CBC - Abnormal; Notable for the following:    RDW 15.5 (*)    All other components within normal limits  URINALYSIS, COMPLETE (UACMP) WITH MICROSCOPIC - Abnormal; Notable for the following:    Color, Urine YELLOW (*)    APPearance CLEAR (*)    All other components within normal limits  LIPASE, BLOOD   ____________________________________________  EKG  None - EKG not ordered by ED physician ____________________________________________  RADIOLOGY   Dg Chest 2 View  Result Date: 03/09/2017 CLINICAL DATA:  Shortness of breath today. The patient reports coughing up  blood this morning. EXAM: CHEST  2 VIEW COMPARISON:  PA and lateral chest 10/12/2006. FINDINGS: The lungs are clear. Heart size normal. No pneumothorax or pleural effusion. No bony abnormality. IMPRESSION: Negative chest. Electronically Signed   By: Drusilla Kanner M.D.   On: 03/09/2017 12:29   Ct Abdomen Pelvis W Contrast  Result Date: 03/09/2017 CLINICAL DATA:  21 year old male with left lower quadrant abdominal pain where Jackson-Pratt drain was placed after surgery. Drain removed 3 weeks ago. Drains were for drainage of ruptured appendix 1 month ago. Subsequent encounter. EXAM: CT ABDOMEN AND PELVIS WITH CONTRAST TECHNIQUE: Multidetector CT imaging of the abdomen and pelvis was performed using the standard protocol following bolus administration of intravenous contrast. CONTRAST:  ISOVUE-300 IOPAMIDOL (ISOVUE-300) INJECTION 61% COMPARISON:  01/25/2017, 01/24/2017 and 01/13/2017 CT. FINDINGS: Lower chest: Lung bases clear.  Heart size within normal limits. Hepatobiliary: No worrisome hepatic lesion or calcified gallstones. Pancreas: No mass or inflammation. Spleen: Accessory splenic tissue. No worrisome mass or enlargement. Adrenals/Urinary Tract: No hydronephrosis. Tiny low-density left renal lesions statistically most likely cysts but too  small to characterize. Noncontrast filled views of the urinary bladder unremarkable. Stomach/Bowel: 2.3 cm from the cecum is a 7.3 mm appendicolith. The appendix distal to this level spans over 1.7 cm and demonstrates prominent mucosal enhancement raising possibility of inflammation. The appendix was not removed at the time of surgery. Currently patient does not have an elevated white count or fever as may be expected with appendicitis. Per discussion with Dr. York Cerise, patient has tenderness over McBurney's point and therefore this is suspicious for recurrent appendicitis. Previously noted adjacent abscess has cleared. No abnormality seen at the entrance site from  drain utilized previously. Portions of colon (particularly sigmoid colon and descending colon) are under distended and evaluation limited with respect to detecting mild inflammation. No extraluminal inflammatory process at this level. Vascular/Lymphatic: No aneurysm or large vessel occlusion. No adenopathy. Reproductive: No worrisome abnormality. Other: No worrisome abnormality. Musculoskeletal: No worrisome osseous lesion. IMPRESSION: 2.3 cm from the cecum is a 7.3 mm appendicolith. The appendix distal to this level spans over 1.7 cm and demonstrates prominent mucosal enhancement raising possibility of inflammation. The appendix was not removed at the time of surgery. Currently patient does not have an elevated white count or fever as may be expected with appendicitis. Per discussion with Dr. York Cerise, patient has tenderness over McBurney's point and therefore this is suspicious for recurrent appendicitis. Previously noted adjacent abscess has cleared. These results were called by telephone at the time of interpretation on 03/09/2017 at 4:29 pm to Dr. Loleta Rose , who verbally acknowledged these results. Electronically Signed   By: Lacy Duverney M.D.   On: 03/09/2017 16:41    ____________________________________________   PROCEDURES  Critical Care performed: No   Procedure(s) performed:   Procedures   ____________________________________________   INITIAL IMPRESSION / ASSESSMENT AND PLAN / ED COURSE  Pertinent labs & imaging results that were available during my care of the patient were reviewed by me and considered in my medical decision making (see chart for details).  I reviewed the notes in the electronic medical record and see that the outpatient surgeons had considered a repeat CT scan but declined because before the drains were removed.  The patient was draining only serosanguineous material.  However now that he is having acute onset of pain at the prior sites and he is tender to  palpation of the right lower quadrant, it is appropriate to evaluate with a CT scan to help the surgeons further characterize his anatomy and the resolution (or lack thereof) of his appendiceal abscesses and to determine if additional treatment or intervention is necessary.  I will touch base with surgery after the imaging is available.  I discussed those with the patient and he does prefer to proceed with imaging first which I think is appropriate.  His labs are reassuring today.  It sounds as if his one episode of blood-tinged sputum was an outlier and he has a reassuring chest x-ray, no indication to add on a CT scan of his chest as well.   Clinical Course as of Mar 09 1805  Tue Mar 09, 2017  1631 Radiologist called to discuss scan.  He is very concerned about early appendicitis given inflammation around the appendicolith that apparently was not present before.  He does not believe that this represents a new abscess.  I will call to discuss with surgery.  [CF]  1635 Left message with OR nurses to have Dr. Everlene Farrier call me back.  [CF]  1652 Spoke by phone with Dr. Everlene Farrier.  He will review the CT and come to the ED to evaluate the patient in person.  [CF]  1751 I spoke in person with Dr. Everlene FarrierPabon who evaluated the patient in person.  The patient states that his pain is gone and he was not tender to palpation.  Dr. Everlene FarrierPabon offered admission but the patient prefers to follow up as an outpatient.  I give the patient my usual and customary return precautions and he will follow up with Dr. Tonita CongWoodham next week.  [CF]    Clinical Course User Index [CF] Loleta RoseForbach, Takoya Jonas, MD    ____________________________________________  FINAL CLINICAL IMPRESSION(S) / ED DIAGNOSES  Final diagnoses:  Lower abdominal pain     MEDICATIONS GIVEN DURING THIS VISIT:  Medications  iopamidol (ISOVUE-300) 61 % injection 15 mL (30 mLs Oral Contrast Given 03/09/17 1427)  iopamidol (ISOVUE-300) 61 % injection 100 mL (100 mLs Intravenous  Contrast Given 03/09/17 1559)     NEW OUTPATIENT MEDICATIONS STARTED DURING THIS VISIT:  Discharge Medication List as of 03/09/2017  5:54 PM      Discharge Medication List as of 03/09/2017  5:54 PM      Discharge Medication List as of 03/09/2017  5:54 PM       Note:  This document was prepared using Dragon voice recognition software and may include unintentional dictation errors.    Loleta RoseForbach, Psalm Arman, MD 03/09/17 1806

## 2017-03-09 NOTE — Discharge Instructions (Signed)
You have been seen in the Emergency Department (ED) for abdominal pain.  Your evaluation did not identify a clear cause of your symptoms but was generally reassuring.  Dr. Everlene FarrierPabon (general surgery) evaluated you in the emergency department and offered to admit you for your appendix, but you both agreed that outpatient follow-up is preferable.  Please do plan to follow up with Dr. Tonita CongWoodham as scheduled previously or at least by next week.  Return to the ED if your abdominal pain worsens or fails to improve, you develop bloody vomiting, bloody diarrhea, you are unable to tolerate fluids due to vomiting, fever greater than 101, or other symptoms that concern you.

## 2017-03-09 NOTE — ED Triage Notes (Signed)
Pt c/o LLQ abdominal pain where JP drain was from surgery.  Drain was removed about 3 weeks ago.  Had drains from surgery r/t ruptured appendix 1 month ago.  Pain has been intermittent and started today. Denies vomiting/diarrhea or fevers. Also had one episode of coughing that had some blood in it.  Denies night sweats. Has been trying to lose weight since gaining while in hospital.

## 2017-03-09 NOTE — ED Notes (Signed)
Patient about to drink a cherry coke in room. Asked patient to hold off on drinking until seeing doctor due to abd pains and emesis. Ambulated back to room with no problems

## 2017-03-11 ENCOUNTER — Telehealth: Payer: Self-pay | Admitting: General Practice

## 2017-03-11 ENCOUNTER — Ambulatory Visit: Payer: Self-pay | Admitting: General Surgery

## 2017-03-11 NOTE — Telephone Encounter (Signed)
Left message for patient to call the office back to reschedule missed appointment on 03/11/17. Please reschedule if patient calls back.

## 2017-03-31 ENCOUNTER — Encounter: Payer: Self-pay | Admitting: General Surgery

## 2017-03-31 ENCOUNTER — Ambulatory Visit (INDEPENDENT_AMBULATORY_CARE_PROVIDER_SITE_OTHER): Payer: Self-pay | Admitting: General Surgery

## 2017-03-31 VITALS — BP 120/83 | HR 82 | Temp 98.4°F | Ht 67.0 in | Wt 154.0 lb

## 2017-03-31 DIAGNOSIS — K3532 Acute appendicitis with perforation and localized peritonitis, without abscess: Secondary | ICD-10-CM

## 2017-03-31 DIAGNOSIS — K352 Acute appendicitis with generalized peritonitis: Secondary | ICD-10-CM

## 2017-03-31 NOTE — Patient Instructions (Addendum)
We have your surgery scheduled for 05/19/17 at [facility] with Dr.Woodham. Please see your blue pre-care sheet for surgery information. Please call our office if you have any questions or concerns.

## 2017-03-31 NOTE — Progress Notes (Signed)
Outpatient Surgical Follow Up  03/31/2017  Jimmy BloomerJoseph E Leach is an 21 y.o. male.   Chief Complaint  Patient presents with  . Follow-up    Discuss Elective appendectomy    HPI: 21 year old male returns to clinic for further follow-up after drainage of a pelvic abscess secondary to ruptured appendicitis. Patient reports that he is recently found a new job and starts it later this week. He denies any abdominal pain. He is eating well and having normal bowel function. He denies any fevers, chills, nausea, vomiting, chest pain, shortness of breath, diarrhea, constipation. He is recently found out that he is to be a new father and is looking for more work to increase his income.   Past Medical History:  Diagnosis Date  . Acute appendicitis with appendiceal abscess 01/2017   required bilateral percutaneous drains  . ADHD   . Appendicitis     Past Surgical History:  Procedure Laterality Date  . LAPAROSCOPIC APPENDECTOMY N/A 01/14/2017   Procedure: LAPAROSCOPIC DRAINAGE OF PELVIC ABSCESS;  Surgeon: Ricarda Frameharles Solae Norling, MD;  Location: [facility] ORS;  Service: General;  Laterality: N/A;    Family History  Problem Relation Age of Onset  . Healthy Mother     Social History:  reports that he has never smoked. He has never used smokeless tobacco. He reports that he drinks alcohol. He reports that he uses drugs, including Marijuana and Other-see comments.  Allergies: No Known Allergies  Medications reviewed.    ROS A multipoint review of systems was completed, all pertinent positives and negatives are documented within the history of present illness and remainder are negative   BP 120/83   Pulse 82   Temp 98.4 F (36.9 C) (Oral)   Ht 5\' 7"  (1.702 m)   Wt 69.9 kg (154 lb)   BMI 24.12 kg/m   Physical Exam Gen.: No acute distress Neck: Supple and nontender Chest: Clear to auscultation Heart: Regular rate and rhythm Abdomen: Soft, nontender, nondistended. Well-healed lap scopic incision  sites of the evidence of erythema, drainage, mass.    No results found for this or any previous visit (from the past 48 hour(s)). No results found.  Assessment/Plan:  1. Ruptured appendicitis 21 year old male status post laparoscopic drainage of a pelvic abscess secondary to ruptured appendicitis. Discussed that the presence of a appendicolith means of the risk of recurrent appendicitis is unacceptably high and an elective laparoscopic appendectomy is indicated. Given that he has just started his new job discussed performing surgery the first week of August to allow him to be established in his new job. The procedure itself was described in detail and he voiced understanding. Dareen Pianonderson has a he will have to follow-up in clinic within 30 days of the operative date for an updated H&P. He'll return to clinic in approximately 1 month with plans for surgery the first week August.  A total of 15 minutes was been this encounter with greater than 50% of it used for or correlation of care.     Ricarda Frameharles Abdullah Rizzi, MD FACS General Surgeon  03/31/2017,12:00 PM

## 2017-04-01 ENCOUNTER — Telehealth: Payer: Self-pay | Admitting: General Surgery

## 2017-04-01 NOTE — Telephone Encounter (Signed)
Pt advised of pre op date/time and sx date. Sx: 05/19/17 with Dr Devoria AlbeWoodham-Laparoscopic appendectomy.  Pre op: 05/11/17 between 9-1:00pm--Phone.   Patient made aware to call [phone], between 1-3:00pm the day before surgery, to find out what time to arrive.     Patient made aware of the 570.00 payment due prior to surgery.   I have also mailed a [place] financial assistance form to patient.

## 2017-05-07 ENCOUNTER — Telehealth: Payer: Self-pay | Admitting: General Surgery

## 2017-05-07 ENCOUNTER — Ambulatory Visit (INDEPENDENT_AMBULATORY_CARE_PROVIDER_SITE_OTHER): Payer: Self-pay | Admitting: General Surgery

## 2017-05-07 ENCOUNTER — Encounter: Payer: Self-pay | Admitting: General Surgery

## 2017-05-07 VITALS — BP 116/76 | HR 69 | Temp 97.3°F | Wt 153.0 lb

## 2017-05-07 DIAGNOSIS — K3532 Acute appendicitis with perforation and localized peritonitis, without abscess: Secondary | ICD-10-CM

## 2017-05-07 DIAGNOSIS — K352 Acute appendicitis with generalized peritonitis: Secondary | ICD-10-CM

## 2017-05-07 NOTE — Patient Instructions (Signed)
We have placed a referral today [facility]. We are sending you to this larger facility because of their specialists and increased resources at their hospital. Upon their office receiving the referral information, they will have a provider review your chart and will call you with an appointment. This process typically takes 5-7 business days. If you do not hear from their office after that time period, please give our office a call so that we may check on the status for you.

## 2017-05-07 NOTE — Progress Notes (Signed)
Outpatient Surgical Follow Up  05/07/2017  Jimmy BloomerJoseph E Leach is an 21 y.o. male.   Chief Complaint  Patient presents with  . Follow-up    Update H&P- Surgery 05/19/17-Laparoscopic Appendectomy    HPI: 21 year old male who is well-known to the surgery service returns to clinic for follow-up prior to a planned surgery for a laparoscopic appendectomy on August 8. Patient reports since his last visit he has had intermittent lower abdominal pains, especially during physical activity. However, patient reports he is also been arrested and no longer has the financial ability to pay the minimum requirement for his surgery. Patient states otherwise he is eating well with normal bowel function. He denies any current fevers, chills, nausea, vomiting, chest pain, shortness of breath  Past Medical History:  Diagnosis Date  . Acute appendicitis with appendiceal abscess 01/2017   required bilateral percutaneous drains  . ADHD   . Appendicitis     Past Surgical History:  Procedure Laterality Date  . LAPAROSCOPIC APPENDECTOMY N/A 01/14/2017   Procedure: LAPAROSCOPIC DRAINAGE OF PELVIC ABSCESS;  Surgeon: Ricarda Frameharles Markeda Narvaez, MD;  Location: [facility] ORS;  Service: General;  Laterality: N/A;    Family History  Problem Relation Age of Onset  . Healthy Mother     Social History:  reports that he has never smoked. He has never used smokeless tobacco. He reports that he drinks alcohol. He reports that he uses drugs, including Marijuana and Other-see comments.  Allergies: No Known Allergies  Medications reviewed.    ROS A multipoint review of systems was completed, all pertinent positives and negatives are documented within the history of present illness and remainder are negative   BP 116/76   Pulse 69   Temp (!) 97.3 F (36.3 C) (Oral)   Wt 69.4 kg (153 lb)   BMI 23.96 kg/m   Physical Exam Gen.: No acute distress Neck: Supple and nontender Chest: Clear to auscultation  heart: Regular  rhythm Abdomen: Soft, nontender, nondistended. Well-healed laparoscopic incision sites from his appendectomy. Extremities: Moves all extremity as well.   No results found for this or any previous visit (from the past 48 hour(s)). No results found.  Assessment/Plan:  1. Ruptured appendicitis 21 year old male presents for follow-up for a planned laparoscopic appendectomy. His previous surgery was a laparoscopic drainage of the pelvic abscess secondary to ruptured appendicitis with the appendix was not satisfactorily identified. Patient voices that he needs to have surgery because he is having intermittent pains. However when questioned about being signed upper medicated or other financial assistance as patient states he would [facility] as he has personal experience with [facility] and how they to the billing. He thinks he can afford to do that. Patient signed his release of information paperwork in clinic and he'll follow up with us on an as-needed basis. [facility] will [facility] today.  A total of 15 minutes was used on this encounter with greater than 50% of it used for counseling or coordination of care.     Ricarda Frameharles Christoph Copelan, MD FACS General Surgeon  05/07/2017,1:12 PM

## 2017-05-07 NOTE — Telephone Encounter (Signed)
Patient has cancelled surgery on 05/19/17 with Dr Tonita CongWoodham. [facility] due to him having approved charity care at this time.   OR has been informed of cancellation as well as Dr Tonita CongWoodham --in office.   I have fax[facility] a [facility]. All clinical notes and imaging has been faxed to [phone].  I will follow up with this referral to make sure appointment has been made.  This referral process could take 7-10 business days due to the [facility] nurse needing to review notes and determine whom the patient should see.

## 2017-05-11 ENCOUNTER — Inpatient Hospital Stay: Admission: RE | Admit: 2017-05-11 | Payer: Self-pay | Source: Ambulatory Visit

## 2017-05-19 ENCOUNTER — Encounter: Admission: RE | Payer: Self-pay | Source: Ambulatory Visit

## 2017-05-19 ENCOUNTER — Ambulatory Visit: Admission: RE | Admit: 2017-05-19 | Payer: Self-pay | Source: Ambulatory Visit | Admitting: General Surgery

## 2017-05-19 DIAGNOSIS — L0291 Cutaneous abscess, unspecified: Secondary | ICD-10-CM

## 2017-05-19 SURGERY — APPENDECTOMY, LAPAROSCOPIC
Anesthesia: Choice

## 2017-06-23 ENCOUNTER — Inpatient Hospital Stay
Admission: EM | Admit: 2017-06-23 | Discharge: 2017-06-26 | DRG: 373 | Disposition: A | Discharge status: Home / Self Care | Payer: Self-pay | Attending: Surgery | Admitting: Surgery

## 2017-06-23 ENCOUNTER — Emergency Department: Payer: Self-pay

## 2017-06-23 DIAGNOSIS — Z8719 Personal history of other diseases of the digestive system: Secondary | ICD-10-CM

## 2017-06-23 DIAGNOSIS — K3532 Acute appendicitis with perforation and localized peritonitis, without abscess: Secondary | ICD-10-CM | POA: Diagnosis present

## 2017-06-23 DIAGNOSIS — K352 Acute appendicitis with generalized peritonitis: Secondary | ICD-10-CM

## 2017-06-23 DIAGNOSIS — K3533 Acute appendicitis with perforation and localized peritonitis, with abscess: Secondary | ICD-10-CM

## 2017-06-23 DIAGNOSIS — K353 Acute appendicitis with localized peritonitis: Principal | ICD-10-CM | POA: Diagnosis present

## 2017-06-23 LAB — CBC
HEMATOCRIT: 42.6 % (ref 40.0–52.0)
Hemoglobin: 14.6 g/dL (ref 13.0–18.0)
MCH: 29.7 pg (ref 26.0–34.0)
MCHC: 34.3 g/dL (ref 32.0–36.0)
MCV: 86.4 fL (ref 80.0–100.0)
Platelets: 219 10*3/uL (ref 150–440)
RBC: 4.94 MIL/uL (ref 4.40–5.90)
RDW: 13.7 % (ref 11.5–14.5)
WBC: 13.9 10*3/uL — ABNORMAL HIGH (ref 3.8–10.6)

## 2017-06-23 LAB — COMPREHENSIVE METABOLIC PANEL
ALBUMIN: 4.1 g/dL (ref 3.5–5.0)
ALT: 17 U/L (ref 17–63)
ANION GAP: 6 (ref 5–15)
AST: 28 U/L (ref 15–41)
Alkaline Phosphatase: 73 U/L (ref 38–126)
BUN: 13 mg/dL (ref 6–20)
CO2: 26 mmol/L (ref 22–32)
Calcium: 9.2 mg/dL (ref 8.9–10.3)
Chloride: 107 mmol/L (ref 101–111)
Creatinine, Ser: 1.2 mg/dL (ref 0.61–1.24)
GFR calc Af Amer: >60 mL/min (ref >60)
GFR calc non Af Amer: >60 mL/min (ref >60)
GLUCOSE: 92 mg/dL (ref 65–99)
POTASSIUM: 4.1 mmol/L (ref 3.5–5.1)
SODIUM: 139 mmol/L (ref 135–145)
Total Bilirubin: 2.5 mg/dL — ABNORMAL HIGH (ref 0.3–1.2)
Total Protein: 7.2 g/dL (ref 6.5–8.1)

## 2017-06-23 LAB — URINALYSIS, COMPLETE (UACMP) WITH MICROSCOPIC
BACTERIA UA: NONE SEEN
Bilirubin Urine: NEGATIVE
Glucose, UA: NEGATIVE mg/dL
HGB URINE DIPSTICK: NEGATIVE
Ketones, ur: NEGATIVE mg/dL
Leukocytes, UA: NEGATIVE
Nitrite: NEGATIVE
PROTEIN: NEGATIVE mg/dL
RBC / HPF: NONE SEEN RBC/hpf (ref 0–5)
Specific Gravity, Urine: >1.046 — ABNORMAL HIGH (ref 1.005–1.030)
Squamous Epithelial / LPF: NONE SEEN
pH: 7 (ref 5.0–8.0)

## 2017-06-23 LAB — URINE DRUG SCREEN, QUALITATIVE (ARMC ONLY)
AMPHETAMINES, UR SCREEN: NOT DETECTED
Barbiturates, Ur Screen: NOT DETECTED
Benzodiazepine, Ur Scrn: NOT DETECTED
CANNABINOID 50 NG, UR ~~LOC~~: POSITIVE — AB
Cocaine Metabolite,Ur ~~LOC~~: NOT DETECTED
MDMA (Ecstasy)Ur Screen: NOT DETECTED
Methadone Scn, Ur: NOT DETECTED
Opiate, Ur Screen: NOT DETECTED
Phencyclidine (PCP) Ur S: NOT DETECTED
TRICYCLIC, UR SCREEN: NOT DETECTED

## 2017-06-23 LAB — LIPASE, BLOOD: Lipase: 23 U/L (ref 11–51)

## 2017-06-23 MED ORDER — KETOROLAC TROMETHAMINE 30 MG/ML IJ SOLN
30.0000 mg | Freq: Four times a day (QID) | INTRAMUSCULAR | Status: DC
  Administered 2017-06-23 – 2017-06-25 (×9): 30 mg via INTRAVENOUS
  Filled 2017-06-23 (×12): qty 1

## 2017-06-23 MED ORDER — ONDANSETRON 4 MG PO TBDP: 4.0000 mg | ORAL_TABLET | Freq: Four times a day (QID) | ORAL | Status: DC | PRN

## 2017-06-23 MED ORDER — PIPERACILLIN-TAZOBACTAM 3.375 G IVPB 30 MIN
3.3750 g | Freq: Once | INTRAVENOUS | Status: AC
  Administered 2017-06-23: 3.375 g via INTRAVENOUS

## 2017-06-23 MED ORDER — KETOROLAC TROMETHAMINE 30 MG/ML IJ SOLN
15.0000 mg | Freq: Once | INTRAMUSCULAR | Status: AC
  Administered 2017-06-23: 15 mg via INTRAVENOUS
  Filled 2017-06-23: qty 1

## 2017-06-23 MED ORDER — SODIUM CHLORIDE 0.9 % IV SOLN
1.0000 g | Freq: Three times a day (TID) | INTRAVENOUS | Status: DC
  Administered 2017-06-23 – 2017-06-26 (×8): 1 g via INTRAVENOUS
  Filled 2017-06-23 (×11): qty 1

## 2017-06-23 MED ORDER — IOPAMIDOL (ISOVUE-300) INJECTION 61%
100.0000 mL | Freq: Once | INTRAVENOUS | Status: AC | PRN
  Administered 2017-06-23: 100 mL via INTRAVENOUS

## 2017-06-23 MED ORDER — LACTATED RINGERS IV SOLN
125.0000 mL/h | INTRAVENOUS | Status: DC
  Administered 2017-06-23 – 2017-06-24 (×3): 125 mL/h via INTRAVENOUS

## 2017-06-23 MED ORDER — HYDROMORPHONE HCL 1 MG/ML IJ SOLN: 0.5000 mg | INTRAMUSCULAR | Status: DC | PRN

## 2017-06-23 MED ORDER — INFLUENZA VAC SPLIT QUAD 0.5 ML IM SUSY
0.5000 mL | PREFILLED_SYRINGE | INTRAMUSCULAR | Status: AC
  Administered 2017-06-24: 0.5 mL via INTRAMUSCULAR
  Filled 2017-06-23: qty 0.5

## 2017-06-23 MED ORDER — ENOXAPARIN SODIUM 40 MG/0.4ML ~~LOC~~ SOLN
40.0000 mg | SUBCUTANEOUS | Status: DC
  Administered 2017-06-24 – 2017-06-26 (×3): 40 mg via SUBCUTANEOUS
  Filled 2017-06-23 (×3): qty 0.4

## 2017-06-23 MED ORDER — PANTOPRAZOLE SODIUM 40 MG IV SOLR
40.0000 mg | Freq: Every day | INTRAVENOUS | Status: DC
  Administered 2017-06-23 – 2017-06-24 (×2): 40 mg via INTRAVENOUS
  Filled 2017-06-23 (×2): qty 40

## 2017-06-23 MED ORDER — PIPERACILLIN-TAZOBACTAM 3.375 G IVPB 30 MIN
INTRAVENOUS | Status: AC
  Filled 2017-06-23: qty 50

## 2017-06-23 MED ORDER — ONDANSETRON HCL 4 MG/2ML IJ SOLN
4.0000 mg | Freq: Four times a day (QID) | INTRAMUSCULAR | Status: DC | PRN
  Administered 2017-06-24: 4 mg via INTRAVENOUS
  Filled 2017-06-23: qty 2

## 2017-06-23 MED ORDER — ONDANSETRON HCL 4 MG/2ML IJ SOLN
4.0000 mg | Freq: Once | INTRAMUSCULAR | Status: AC
  Administered 2017-06-23: 4 mg via INTRAVENOUS
  Filled 2017-06-23: qty 2

## 2017-06-23 MED ORDER — PIPERACILLIN-TAZOBACTAM 3.375 G IVPB
3.3750 g | Freq: Three times a day (TID) | INTRAVENOUS | Status: DC
  Administered 2017-06-23: 3.375 g via INTRAVENOUS
  Filled 2017-06-23 (×3): qty 50

## 2017-06-23 NOTE — Consult Note (Signed)
[name] Infectious Disease     Reason for Consult: Appendicitis    Referring Physician: Piscoya Date of Admission:  06/23/2017   Active Problems:   Perforated appendicitis   HPI: Jimmy Leach is a 21 y.o. male admitted with weakness, abd pain on R side. On admit wbc 13.9. No fever. CT shows recurrent appendicitis.  He has complicated history with prior perfed appy requiring drainage of abscess and drain placement. Prior cx with ESBL Ecoli. He was treated and improved by July and was scheduled for elective Appy but missed multiple appointments and did not have surgery   Past Medical History:  Diagnosis Date  . Acute appendicitis with appendiceal abscess 01/2017   required bilateral percutaneous drains  . ADHD   . Appendicitis    Past Surgical History:  Procedure Laterality Date  . LAPAROSCOPIC APPENDECTOMY N/A 01/14/2017   Procedure: LAPAROSCOPIC DRAINAGE OF PELVIC ABSCESS;  Surgeon: Clayburn Pert, MD;  Location: [facility] ORS;  Service: General;  Laterality: N/A;   Social History  Substance Use Topics  . Smoking status: Never Smoker  . Smokeless tobacco: Never Used  . Alcohol use Yes     Comment: Occasional   Family History  Problem Relation Age of Onset  . Healthy Mother     Allergies: No Known Allergies  Current antibiotics: Antibiotics Given (last 72 hours)    Date/Time Action Medication Dose Rate   06/23/17 0943 New Bag/Given   piperacillin-tazobactam (ZOSYN) IVPB 3.375 g 3.375 g 100 mL/hr      MEDICATIONS: . [START ON 06/24/2017] enoxaparin (LOVENOX) injection  40 mg Subcutaneous Q24H  . [START ON 06/24/2017] Influenza vac split quadrivalent PF  0.5 mL Intramuscular Tomorrow-1000  . ketorolac  30 mg Intravenous Q6H  . pantoprazole (PROTONIX) IV  40 mg Intravenous QHS    Review of Systems - 11 systems reviewed and negative per HPI   OBJECTIVE: Temp:  [97.7 F (36.5 C)-97.8 F (36.6 C)] 97.7 F (36.5 C) (09/12 1105) Pulse Rate:  [55-78] 57 (09/12  1105) Resp:  [11-19] 16 (09/12 1105) BP: (121-140)/(75-86) 140/75 (09/12 1105) SpO2:  [98 %-100 %] 99 % (09/12 1105) Weight:  [70.3 kg (155 lb)] 70.3 kg (155 lb) (09/12 0716) Physical Exam  Constitutional: He is oriented to person, place, and time. He appears well-developed and well-nourished. No distress.  HENT:  Mouth/Throat: Oropharynx is clear and moist. No oropharyngeal exudate.  Cardiovascular: Normal rate, regular rhythm and normal heart sounds. Exam reveals no gallop and no friction rub.  No murmur heard.  Pulmonary/Chest: Effort normal and breath sounds normal. No respiratory distress. He has no wheezes.  Abdominal: Soft. Bowel sounds are normal. He exhibits no distension. Mild ttp RLQ no rb g Lymphadenopathy:  He has no cervical adenopathy.  Neurological: He is alert and oriented to person, place, and time.  Skin: Skin is warm and dry. No rash noted. No erythema.  Psychiatric: He has a normal mood and affect. His behavior is normal.     LABS: Results for orders placed or performed during the hospital encounter of 06/23/17 (from the past 48 hour(s))  Lipase, blood     Status: None   Collection Time: 06/23/17  7:49 AM  Result Value Ref Range   Lipase 23 11 - 51 U/L  Comprehensive metabolic panel     Status: Abnormal   Collection Time: 06/23/17  7:49 AM  Result Value Ref Range   Sodium 139 135 - 145 mmol/L   Potassium 4.1 3.5 - 5.1 mmol/L  Chloride 107 101 - 111 mmol/L   CO2 26 22 - 32 mmol/L   Glucose, Bld 92 65 - 99 mg/dL   BUN 13 6 - 20 mg/dL   Creatinine, Ser 1.20 0.61 - 1.24 mg/dL   Calcium 9.2 8.9 - 10.3 mg/dL   Total Protein 7.2 6.5 - 8.1 g/dL   Albumin 4.1 3.5 - 5.0 g/dL   AST 28 15 - 41 U/L   ALT 17 17 - 63 U/L   Alkaline Phosphatase 73 38 - 126 U/L   Total Bilirubin 2.5 (H) 0.3 - 1.2 mg/dL   GFR calc non Af Amer >60 >60 mL/min   GFR calc Af Amer >60 >60 mL/min    Comment: (NOTE) The eGFR has been calculated using the CKD EPI equation. This calculation  has not been validated in all clinical situations. eGFR's persistently <60 mL/min signify possible Chronic Kidney Disease.    Anion gap 6 5 - 15  CBC     Status: Abnormal   Collection Time: 06/23/17  7:49 AM  Result Value Ref Range   WBC 13.9 (H) 3.8 - 10.6 K/uL   RBC 4.94 4.40 - 5.90 MIL/uL   Hemoglobin 14.6 13.0 - 18.0 g/dL   HCT 42.6 40.0 - 52.0 %   MCV 86.4 80.0 - 100.0 fL   MCH 29.7 26.0 - 34.0 pg   MCHC 34.3 32.0 - 36.0 g/dL   RDW 13.7 11.5 - 14.5 %   Platelets 219 150 - 440 K/uL  Urinalysis, Complete w Microscopic     Status: Abnormal   Collection Time: 06/23/17  9:41 AM  Result Value Ref Range   Color, Urine YELLOW (A) YELLOW   APPearance CLEAR (A) CLEAR   Specific Gravity, Urine >1.046 (H) 1.005 - 1.030   pH 7.0 5.0 - 8.0   Glucose, UA NEGATIVE NEGATIVE mg/dL   Hgb urine dipstick NEGATIVE NEGATIVE   Bilirubin Urine NEGATIVE NEGATIVE   Ketones, ur NEGATIVE NEGATIVE mg/dL   Protein, ur NEGATIVE NEGATIVE mg/dL   Nitrite NEGATIVE NEGATIVE   Leukocytes, UA NEGATIVE NEGATIVE   RBC / HPF NONE SEEN 0 - 5 RBC/hpf   WBC, UA 0-5 0 - 5 WBC/hpf   Bacteria, UA NONE SEEN NONE SEEN   Squamous Epithelial / LPF NONE SEEN NONE SEEN   Mucus PRESENT   Urine Drug Screen, Qualitative ([facility] only)     Status: Abnormal   Collection Time: 06/23/17  9:41 AM  Result Value Ref Range   Tricyclic, Ur Screen NONE DETECTED NONE DETECTED   Amphetamines, Ur Screen NONE DETECTED NONE DETECTED   MDMA (Ecstasy)Ur Screen NONE DETECTED NONE DETECTED   Cocaine Metabolite,Ur [place] NONE DETECTED NONE DETECTED   Opiate, Ur Screen NONE DETECTED NONE DETECTED   Phencyclidine (PCP) Ur S NONE DETECTED NONE DETECTED   Cannabinoid 50 Ng, Ur [place] POSITIVE (A) NONE DETECTED   Barbiturates, Ur Screen NONE DETECTED NONE DETECTED   Benzodiazepine, Ur Scrn NONE DETECTED NONE DETECTED   Methadone Scn, Ur NONE DETECTED NONE DETECTED    Comment: (NOTE) 245  Tricyclics, urine               Cutoff 1000 ng/mL 200   Amphetamines, urine             Cutoff 1000 ng/mL 300  MDMA (Ecstasy), urine           Cutoff 500 ng/mL 400  Cocaine Metabolite, urine       Cutoff 300 ng/mL 500  Opiate, urine  Cutoff 300 ng/mL 600  Phencyclidine (PCP), urine      Cutoff 25 ng/mL 700  Cannabinoid, urine              Cutoff 50 ng/mL 800  Barbiturates, urine             Cutoff 200 ng/mL 900  Benzodiazepine, urine           Cutoff 200 ng/mL 1000 Methadone, urine                Cutoff 300 ng/mL 1100 1200 The urine drug screen provides only a preliminary, unconfirmed 1300 analytical test result and should not be used for non-medical 1400 purposes. Clinical consideration and professional judgment should 1500 be applied to any positive drug screen result due to possible 1600 interfering substances. A more specific alternate chemical method 1700 must be used in order to obtain a confirmed analytical result.  1800 Gas chromato graphy / mass spectrometry (GC/MS) is the preferred 1900 confirmatory method.    No components found for: ESR, C REACTIVE PROTEIN MICRO: No results found for this or any previous visit (from the past 720 hour(s)).  IMAGING: Ct Abdomen Pelvis W Contrast  Result Date: 06/23/2017 CLINICAL DATA:  Intermittent abdominal pain. History of ruptured appendicitis and laparoscopic pelvic abscess drainage without removal of the appendix. EXAM: CT ABDOMEN AND PELVIS WITH CONTRAST TECHNIQUE: Multidetector CT imaging of the abdomen and pelvis was performed using the standard protocol following bolus administration of intravenous contrast. CONTRAST:  183m ISOVUE-300 IOPAMIDOL (ISOVUE-300) INJECTION 61% COMPARISON:  CT abdomen pelvis dated Mar 09, 2017. FINDINGS: Lower chest: No acute abnormality. Hepatobiliary: No focal liver abnormality is seen. No gallstones, gallbladder wall thickening, or biliary dilatation. Pancreas: Unremarkable. No pancreatic ductal dilatation or surrounding inflammatory changes.  Spleen: Normal in size without focal abnormality. Adrenals/Urinary Tract: The adrenal glands are unremarkable. Tiny subcentimeter low-density lesions in both kidneys remain too small to characterize, but are stable. No hydronephrosis. The bladder is unremarkable. Stomach/Bowel: Again seen is an appendicolith at the base of the appendix. The visualized portion of the appendix is dilated to 11 mm, and demonstrates significant wall thickening with mucosal hyperenhancement. There is new prominent fluid and inflammation surrounding the appendix. The appendix appears to terminate in the region of a 2.4 x 1.4 x 1.7 cm rim enhancing fluid collection, concerning for abscess. There is wall thickening of the distal and terminal ileum, likely reactive. The stomach, remaining small bowel, and colon are unremarkable. No bowel obstruction. Vascular/Lymphatic: No significant vascular findings are present. No enlarged abdominal or pelvic lymph nodes. Prominent subcentimeter right lower quadrant mesenteric lymph nodes are likely reactive. Reproductive: Prostate is unremarkable. Other: Small amount of free fluid in the pelvis. No pneumoperitoneum. Musculoskeletal: No acute or significant osseous findings. IMPRESSION: Findings consistent with recurrent appendicitis. The inflamed appendix appears to terminate in the region of a 2.4 x 1.4 x 1.7 cm rim enhancing fluid collection, concerning for abscess. These results were called by telephone at the time of interpretation on 06/23/2017 at 9:16 am to Dr. CRudene Re, who verbally acknowledged these results. Electronically Signed   By: WTitus DubinM.D.   On: 06/23/2017 09:19    Assessment:   JCLOYD RAGASis a 21y.o. male admitted with recurrent appendicitis. He was initially admitted 4/4 with several days abd pain and found to have ruptured appendicitis. Underwent surgery and was dced 4/10 on oral augmentin with JP drains in place. Readmitted 4/15 with recurrent pain and  fever and found to have elevated WBC and CT showed a pelvic abscess. Underwent IR placement of drain on 4/16 with cxs growing ESBL E coli.   At that admission he was treated with augmentin and bactrim and had resolution of infection. Failed to follow up for elective appy and now has recurrent infection   Recommendations Change to meropenem while inpatient and on IV abx At dc would send on oral bactrim DS one BID  to cover the ESBL E coli, as well as oral augmentin 875 bid as intraabdominal infections are usually polymicrobial Would rec 14 day course with otpt fu with surgery and decision regarding future surgery at that time.   Thank you very much for allowing me to participate in the care of this patient. Please call with questions.   Cheral Marker. Ola Spurr, MD

## 2017-06-23 NOTE — ED Provider Notes (Signed)
[facility] Emergency Department Provider Note  ____________________________________________  Time seen: Approximately 7:47 AM  I have reviewed the triage vital signs and the nursing notes.   HISTORY  Chief Complaint Abdominal Pain   HPI Jimmy Leach is a 21 y.o. male with a history of recurrent appendicitis who presents for evaluation of right lower quadrant abdominal pain.patient was diagnosed with appendicitis back in April. He was taken to the operating room however the appendix had ruptured and due to significant inflammation, the appendix was not found and removed. Since then patient has had several complications including intra-abdominal abscesses requiring IV antibiotics and has had pain on and off. Yesterday he said the pain started to get worse, just dull, constant, currently 5 out of 10, located in the right lower quadrant and associated with nausea. No vomiting. No fever or chills. No dysuria hematuria. No constipation or diarrhea. Patient reports that he had an appointment to have his appendix removed last week however he missed it.   Past Medical History:  Diagnosis Date  . Acute appendicitis with appendiceal abscess 01/2017   required bilateral percutaneous drains  . ADHD   . Appendicitis     Patient Active Problem List   Diagnosis Date Noted  . Perforated appendicitis 06/23/2017  . Abscess   . Lower abdominal pain   . Appendicitis   . Postprocedural intraabdominal abscess 01/24/2017  . Ruptured appendicitis   . Polysubstance abuse 04/20/2014    Past Surgical History:  Procedure Laterality Date  . LAPAROSCOPIC APPENDECTOMY N/A 01/14/2017   Procedure: LAPAROSCOPIC DRAINAGE OF PELVIC ABSCESS;  Surgeon: Ricarda Frame, MD;  Location: [facility] ORS;  Service: General;  Laterality: N/A;    Prior to Admission medications   Not on File    Allergies Patient has no known allergies.  Family History  Problem Relation Age of Onset  .  Healthy Mother     Social History Social History  Substance Use Topics  . Smoking status: Never Smoker  . Smokeless tobacco: Never Used  . Alcohol use Yes     Comment: Occasional    Review of Systems  Constitutional: Negative for fever. Eyes: Negative for visual changes. ENT: Negative for sore throat. Neck: No neck pain  Cardiovascular: Negative for chest pain. Respiratory: Negative for shortness of breath. Gastrointestinal: + RLQ abdominal pain and nausea. No vomiting or diarrhea. Genitourinary: Negative for dysuria. Musculoskeletal: Negative for back pain. Skin: Negative for rash. Neurological: Negative for headaches, weakness or numbness. Psych: No SI or HI  ____________________________________________   PHYSICAL EXAM:  VITAL SIGNS: ED Triage Vitals  Enc Vitals Group     BP 06/23/17 0715 127/82     Pulse Rate 06/23/17 0715 78     Resp --      Temp 06/23/17 0715 97.8 F (36.6 C)     Temp Source 06/23/17 0715 Oral     SpO2 06/23/17 0715 98 %     Weight 06/23/17 0716 155 lb (70.3 kg)     Height 06/23/17 0716  (1.702 m)     Head Circumference --      Peak Flow --      Pain Score 06/23/17 0715 7     Pain Loc --      Pain Edu? --      Excl. in GC? --     Constitutional: Alert and oriented. Well appearing and in no apparent distress. HEENT:      Head: Normocephalic and atraumatic.  Eyes: Conjunctivae are normal. Sclera is non-icteric.       Mouth/Throat: Mucous membranes are moist.       Neck: Supple with no signs of meningismus. Cardiovascular: Regular rate and rhythm. No murmurs, gallops, or rubs. 2+ symmetrical distal pulses are present in all extremities. No JVD. Respiratory: Normal respiratory effort. Lungs are clear to auscultation bilaterally. No wheezes, crackles, or rhonchi.  Gastrointestinal: Soft, ttp over the RLQ with positive Rovsing sign, and non distended with positive bowel sounds. No rebound or guarding. Musculoskeletal: Nontender  with normal range of motion in all extremities. No edema, cyanosis, or erythema of extremities. Neurologic: Normal speech and language. Face is symmetric. Moving all extremities. No gross focal neurologic deficits are appreciated. Skin: Skin is warm, dry and intact. No rash noted. Psychiatric: Mood and affect are normal. Speech and behavior are normal.  ____________________________________________   LABS (all labs ordered are listed, but only abnormal results are displayed)  Labs Reviewed  COMPREHENSIVE METABOLIC PANEL - Abnormal; Notable for the following:       Result Value   Total Bilirubin 2.5 (*)    All other components within normal limits  CBC - Abnormal; Notable for the following:    WBC 13.9 (*)    All other components within normal limits  URINALYSIS, COMPLETE (UACMP) WITH MICROSCOPIC - Abnormal; Notable for the following:    Color, Urine YELLOW (*)    APPearance CLEAR (*)    Specific Gravity, Urine >1.046 (*)    All other components within normal limits  URINE DRUG SCREEN, QUALITATIVE ([facility] ONLY) - Abnormal; Notable for the following:    Cannabinoid 50 Ng, Ur [place] POSITIVE (*)    All other components within normal limits  LIPASE, BLOOD   ____________________________________________  EKG  ED ECG REPORT I, Nita Sickle, the attending physician, personally viewed and interpreted this ECG.  Normal sinus rhythm, rate of 61, normal intervals, normal axis, no ST elevations or depressions. ____________________________________________  RADIOLOGY  CT a/p: Findings consistent with recurrent appendicitis. The inflamed appendix appears to terminate in the region of a 2.4 x 1.4 x 1.7 cm rim enhancing fluid collection, concerning for abscess.  ____________________________________________   PROCEDURES  Procedure(s) performed: None Procedures Critical Care performed: yes  CRITICAL CARE Performed by: Nita Sickle  ?  Total critical care time: 35  min  Critical care time was exclusive of separately billable procedures and treating other patients.  Critical care was necessary to treat or prevent imminent or life-threatening deterioration.  Critical care was time spent personally by me on the following activities: development of treatment plan with patient and/or surrogate as well as nursing, discussions with consultants, evaluation of patient's response to treatment, examination of patient, obtaining history from patient or surrogate, ordering and performing treatments and interventions, ordering and review of laboratory studies, ordering and review of radiographic studies, pulse oximetry and re-evaluation of patient's condition.  ____________________________________________   INITIAL IMPRESSION / ASSESSMENT AND PLAN / ED COURSE  21 y.o. male with a history of recurrent appendicitis who presents for evaluation of right lower quadrant abdominal pain. patient is well-appearing, in no distress, has normal vital signs, his abdomen is soft and nondistended, he is tender to palpation on the right lower quadrant with positive Rovsing sign concerning for recurrent appendicitis. We'll get basic blood work and repeat CT abdomen and pelvis. We'll give Zofran and Toradol for symptom relief.    _________________________  ED COURSE: CT concerning for recurrent appendicitis and a 2.4 x 1.4 x  1.7 cm abscess. Patient was started on Zosyn and was admitted to Dr. Aleen CampiPiscoya, surgery on call for surgical management  Pertinent labs & imaging results that were available during my care of the patient were reviewed by me and considered in my medical decision making (see chart for details).    ____________________________________________   FINAL CLINICAL IMPRESSION(S) / ED DIAGNOSES  Final diagnoses:  Acute appendicitis with peritoneal abscess      NEW MEDICATIONS STARTED DURING THIS VISIT:  There are no discharge medications for this  patient.    Note:  This document was prepared using Dragon voice recognition software and may include unintentional dictation errors.    Don PerkingVeronese, [place], MD 06/23/17 1153

## 2017-06-23 NOTE — Care Management Note (Signed)
Case Management Note  Patient Details  Name: Jimmy Leach MRN: [id number] Date of Birth: 12/22/1995  Subjective/Objective:        Spoke to patient and family with Dr Aleen CampiPiscoya, about options for charity care. I have explained both [facility] as well as [facility]. I have given the family member at bedside both applications. In talking to the patient I have explained that it is important to file both applications right away. The patient has expressed that he has been dealing with this issue for sometime and is tired of feeling unwell. He does admit to us that he did not [facility], nor did he finish the oral antibiotic course given previously. He still has some of the medication at home, which he stopped taking once he felt better.    He has vocalized that he requested to go to another hospital thinking they could do something different for him. In asking Dr Aleen CampiPiscoya about this, he has reinforced that best practice should be the same no matter where you go, and that the patient has to do his part of the follow through to get better. For now the patient is going to stay here and the plan is IVF and IV ABX.They are going to to try to drain the abcess in IR. If these are not successful then the MD will look at surgically intervening with the abcess.         Action/Plan:   Expected Discharge Date:                  Expected Discharge Plan:     In-House Referral:     Discharge planning Services     Post Acute Care Choice:    Choice offered to:     DME Arranged:    DME Agency:     HH Arranged:    HH Agency:     Status of Service:     If discussed at [employer] Length of Stay Meetings, dates discussed:    Additional Comments:  Berna BueCheryl Denina Rieger, RN 06/23/2017, 10:35 AM

## 2017-06-23 NOTE — Progress Notes (Signed)
Pharmacy Antibiotic Note  Jimmy Leach is a 21 y.o. male admitted on 06/23/2017 with intra-abdominal infection and PMH of ESBL infection.  Pharmacy has been consulted for meropenem dosing.  Plan: Will start patient on meropenem 1g IV every 8 hours.   Height: 5\' 7"  (170.2 cm) Weight: 155 lb (70.3 kg) IBW/kg (Calculated) : 66.1  Temp (24hrs), Avg:97.8 F (36.6 C), Min:97.7 F (36.5 C), Max:97.8 F (36.6 C)   Recent Labs Lab 06/23/17 0749  WBC 13.9*  CREATININE 1.20    Estimated Creatinine Clearance: 89.5 mL/min (by C-G formula based on SCr of 1.2 mg/dL).    No Known Allergies  Antimicrobials this admission: 9/12 meropenem >>   Dose adjustments this admission:  Microbiology results:  Thank you for allowing pharmacy to be a part of this patient's care.  Gardner CandleSheema M Owens Hara, PharmD, BCPS Clinical Pharmacist 06/23/2017 7:25 PM

## 2017-06-23 NOTE — H&P (Signed)
Date of Admission:  06/23/2017  Reason for Admission:  Perforated appendicitis  History of Present Illness: Jimmy BloomerJoseph E Rabideau is a 21 y.o. male who presents with a two day history of recurrent abdominal pain.  He has a history of appendicitis and had attempt at laparoscopic appendectomy with Dr. Tonita CongWoodham on 01/2017.  However, due to extensive inflammation, appendix was not identified and drains were left in place.  He developed an appendiceal abscess subsequently requiring further drain placement.  After drains were all removed, he has developed intermittent episodes of pain controlled with antibiotic course.  He was scheduled to undergo another laparoscopic appendectomy in 05/2017, but due to financial issues, the patient canceled surgery and [facility] so he can apply for charity care.  He never followed up with them and now presents with pain that started yesterday while at work.  Denies any nausea or vomiting or diarrhea. Pain is in the right lower quadrant, localized, with no radiation.  Past Medical History: Past Medical History:  Diagnosis Date  . Acute appendicitis with appendiceal abscess 01/2017   required bilateral percutaneous drains  . ADHD   . Appendicitis      Past Surgical History: Past Surgical History:  Procedure Laterality Date  . LAPAROSCOPIC APPENDECTOMY N/A 01/14/2017   Procedure: LAPAROSCOPIC DRAINAGE OF PELVIC ABSCESS;  Surgeon: Ricarda Frameharles Woodham, MD;  Location: [facility] ORS;  Service: General;  Laterality: N/A;    Home Medications: Prior to Admission medications   None    Allergies: No Known Allergies  Social History:  reports that he has never smoked. He has never used smokeless tobacco. He reports that he drinks alcohol. He reports that he uses drugs, including Marijuana and Other-see comments.   Family History: Family History  Problem Relation Age of Onset  . Healthy Mother     Review of Systems: Review of Systems   Constitutional: Negative for chills and fever.  HENT: Negative for hearing loss.   Eyes: Negative for blurred vision.  Respiratory: Negative for cough.   Cardiovascular: Negative for chest pain.  Gastrointestinal: Positive for abdominal pain. Negative for constipation, diarrhea, nausea and vomiting.  Genitourinary: Negative for dysuria.  Musculoskeletal: Negative for myalgias.  Skin: Negative for rash.  Neurological: Negative for dizziness.  Psychiatric/Behavioral: Negative for depression.  All other systems reviewed and are negative.   Physical Exam BP 140/75 (BP Location: Right Arm)   Pulse (!) 57   Temp 97.7 F (36.5 C) (Oral)   Resp 16   Ht 5\' 7"  (1.702 m)   Wt 70.3 kg (155 lb)   SpO2 99%   BMI 24.28 kg/m  CONSTITUTIONAL: No acute distress HEENT:  Normocephalic, atraumatic, extraocular motion intact. NECK: Trachea is midline, and there is no jugular venous distension.  RESPIRATORY:  Lungs are clear, and breath sounds are equal bilaterally. Normal respiratory effort without pathologic use of accessory muscles. CARDIOVASCULAR: Heart is regular without murmurs, gallops, or rubs. GI: The abdomen is soft, nondistended, with tenderness to palpation in the right lower quadrant.  Pain is very localized without any diffuse pain.  No palpable masses.  MUSCULOSKELETAL:  Normal muscle strength and tone in all four extremities.  No peripheral edema or cyanosis. SKIN: Skin turgor is normal. There are no pathologic skin lesions.  NEUROLOGIC:  Motor and sensation is grossly normal.  Cranial nerves are grossly intact. PSYCH:  Alert and oriented to person, place and time. Affect is normal.  Laboratory Analysis: Results for orders placed or  performed during the hospital encounter of 06/23/17 (from the past 24 hour(s))  Lipase, blood     Status: None   Collection Time: 06/23/17  7:49 AM  Result Value Ref Range   Lipase 23 11 - 51 U/L  Comprehensive metabolic panel     Status: Abnormal    Collection Time: 06/23/17  7:49 AM  Result Value Ref Range   Sodium 139 135 - 145 mmol/L   Potassium 4.1 3.5 - 5.1 mmol/L   Chloride 107 101 - 111 mmol/L   CO2 26 22 - 32 mmol/L   Glucose, Bld 92 65 - 99 mg/dL   BUN 13 6 - 20 mg/dL   Creatinine, Ser 8.65 0.61 - 1.24 mg/dL   Calcium 9.2 8.9 - 78.4 mg/dL   Total Protein 7.2 6.5 - 8.1 g/dL   Albumin 4.1 3.5 - 5.0 g/dL   AST 28 15 - 41 U/L   ALT 17 17 - 63 U/L   Alkaline Phosphatase 73 38 - 126 U/L   Total Bilirubin 2.5 (H) 0.3 - 1.2 mg/dL   GFR calc non Af Amer >60 >60 mL/min   GFR calc Af Amer >60 >60 mL/min   Anion gap 6 5 - 15  CBC     Status: Abnormal   Collection Time: 06/23/17  7:49 AM  Result Value Ref Range   WBC 13.9 (H) 3.8 - 10.6 K/uL   RBC 4.94 4.40 - 5.90 MIL/uL   Hemoglobin 14.6 13.0 - 18.0 g/dL   HCT 69.6 29.5 - 28.4 %   MCV 86.4 80.0 - 100.0 fL   MCH 29.7 26.0 - 34.0 pg   MCHC 34.3 32.0 - 36.0 g/dL   RDW 13.2 44.0 - 10.2 %   Platelets 219 150 - 440 K/uL  Urinalysis, Complete w Microscopic     Status: Abnormal   Collection Time: 06/23/17  9:41 AM  Result Value Ref Range   Color, Urine YELLOW (A) YELLOW   APPearance CLEAR (A) CLEAR   Specific Gravity, Urine >1.046 (H) 1.005 - 1.030   pH 7.0 5.0 - 8.0   Glucose, UA NEGATIVE NEGATIVE mg/dL   Hgb urine dipstick NEGATIVE NEGATIVE   Bilirubin Urine NEGATIVE NEGATIVE   Ketones, ur NEGATIVE NEGATIVE mg/dL   Protein, ur NEGATIVE NEGATIVE mg/dL   Nitrite NEGATIVE NEGATIVE   Leukocytes, UA NEGATIVE NEGATIVE   RBC / HPF NONE SEEN 0 - 5 RBC/hpf   WBC, UA 0-5 0 - 5 WBC/hpf   Bacteria, UA NONE SEEN NONE SEEN   Squamous Epithelial / LPF NONE SEEN NONE SEEN   Mucus PRESENT   Urine Drug Screen, Qualitative ([facility] only)     Status: Abnormal   Collection Time: 06/23/17  9:41 AM  Result Value Ref Range   Tricyclic, Ur Screen NONE DETECTED NONE DETECTED   Amphetamines, Ur Screen NONE DETECTED NONE DETECTED   MDMA (Ecstasy)Ur Screen NONE DETECTED NONE DETECTED   Cocaine  Metabolite,Ur [place] NONE DETECTED NONE DETECTED   Opiate, Ur Screen NONE DETECTED NONE DETECTED   Phencyclidine (PCP) Ur S NONE DETECTED NONE DETECTED   Cannabinoid 50 Ng, Ur [place] POSITIVE (A) NONE DETECTED   Barbiturates, Ur Screen NONE DETECTED NONE DETECTED   Benzodiazepine, Ur Scrn NONE DETECTED NONE DETECTED   Methadone Scn, Ur NONE DETECTED NONE DETECTED    Imaging: Ct Abdomen Pelvis W Contrast  Result Date: 06/23/2017 CLINICAL DATA:  Intermittent abdominal pain. History of ruptured appendicitis and laparoscopic pelvic abscess drainage without removal of the appendix.  EXAM: CT ABDOMEN AND PELVIS WITH CONTRAST TECHNIQUE: Multidetector CT imaging of the abdomen and pelvis was performed using the standard protocol following bolus administration of intravenous contrast. CONTRAST:  ISOVUE-300 IOPAMIDOL (ISOVUE-300) INJECTION 61% COMPARISON:  CT abdomen pelvis dated Mar 09, 2017. FINDINGS: Lower chest: No acute abnormality. Hepatobiliary: No focal liver abnormality is seen. No gallstones, gallbladder wall thickening, or biliary dilatation. Pancreas: Unremarkable. No pancreatic ductal dilatation or surrounding inflammatory changes. Spleen: Normal in size without focal abnormality. Adrenals/Urinary Tract: The adrenal glands are unremarkable. Tiny subcentimeter low-density lesions in both kidneys remain too small to characterize, but are stable. No hydronephrosis. The bladder is unremarkable. Stomach/Bowel: Again seen is an appendicolith at the base of the appendix. The visualized portion of the appendix is dilated to 11 mm, and demonstrates significant wall thickening with mucosal hyperenhancement. There is new prominent fluid and inflammation surrounding the appendix. The appendix appears to terminate in the region of a 2.4 x 1.4 x 1.7 cm rim enhancing fluid collection, concerning for abscess. There is wall thickening of the distal and terminal ileum, likely reactive. The stomach, remaining small bowel,  and colon are unremarkable. No bowel obstruction. Vascular/Lymphatic: No significant vascular findings are present. No enlarged abdominal or pelvic lymph nodes. Prominent subcentimeter right lower quadrant mesenteric lymph nodes are likely reactive. Reproductive: Prostate is unremarkable. Other: Small amount of free fluid in the pelvis. No pneumoperitoneum. Musculoskeletal: No acute or significant osseous findings. IMPRESSION: Findings consistent with recurrent appendicitis. The inflamed appendix appears to terminate in the region of a 2.4 x 1.4 x 1.7 cm rim enhancing fluid collection, concerning for abscess. These results were called by telephone at the time of interpretation on 06/23/2017 at 9:16 am to Dr. Nita Sickle , who verbally acknowledged these results. Electronically Signed   By: Obie Dredge M.D.   On: 06/23/2017 09:19    Assessment and Plan: This is a 21 y.o. male who presents with a recurrent appendicitis picture with periappendiceal abscess.  I have independently viewed the patient's imaging study and reviewed his laboratory studies.  Overall, his appendix is inflamed and has a small abscess distally.  His WBC is elevated to 13.9.  Discussed CT scan findings with radiology and the abscess is small and in a location that is not amenable for drainage.  Discussed with the patient that will be admitting to surgical team and start on IV antibiotics as previously done.  Will keep NPO with IV fluid hydration and appropriate pain and nausea control.  Given his prior abscess culture resulting in E coli ESBL, will place ID consult as well to help with antibiotic management and help with oral transition for discharge.  Patient understands that he will be here for a few days and may need repeat CT scan to evaluate abscess.  If there is no improvement, he may require surgery.  Patient did ask about transferring to [facility] and start the charity process there now.  Discussed with ED team and Case  Management, and at this time, transfer would not be feasible.  Patient was given the charity forms for both [facility] and [facility] to fill out while he's here.   Howie Ill, MD [facility]

## 2017-06-23 NOTE — ED Triage Notes (Signed)
Pt reports has a surgery in May for his appendix but it was not removed. Pt reports they put some tubes in him and now he is having intermittent pain. Pt reports the pain is severe at times and getting worse with each episode. Pt reports has had 2 surgical procedures related to his abdomen and is concerned.

## 2017-06-24 DIAGNOSIS — K353 Acute appendicitis with localized peritonitis: Principal | ICD-10-CM

## 2017-06-24 DIAGNOSIS — K3533 Acute appendicitis with perforation and localized peritonitis, with abscess: Secondary | ICD-10-CM

## 2017-06-24 LAB — BASIC METABOLIC PANEL
ANION GAP: 6 (ref 5–15)
BUN: 15 mg/dL (ref 6–20)
CO2: 25 mmol/L (ref 22–32)
Calcium: 8.8 mg/dL — ABNORMAL LOW (ref 8.9–10.3)
Chloride: 109 mmol/L (ref 101–111)
Creatinine, Ser: 0.92 mg/dL (ref 0.61–1.24)
GFR calc non Af Amer: >60 mL/min (ref >60)
GLUCOSE: 98 mg/dL (ref 65–99)
POTASSIUM: 3.6 mmol/L (ref 3.5–5.1)
Sodium: 140 mmol/L (ref 135–145)

## 2017-06-24 LAB — CBC WITH DIFFERENTIAL/PLATELET
BASOS ABS: 0.1 10*3/uL (ref 0–0.1)
BASOS PCT: 1 %
EOS ABS: 0.1 10*3/uL (ref 0–0.7)
Eosinophils Relative: 1 %
HEMATOCRIT: 40.2 % (ref 40.0–52.0)
HEMOGLOBIN: 13.7 g/dL (ref 13.0–18.0)
Lymphocytes Relative: 20 %
Lymphs Abs: 2.2 10*3/uL (ref 1.0–3.6)
MCH: 29 pg (ref 26.0–34.0)
MCHC: 34 g/dL (ref 32.0–36.0)
MCV: 85.2 fL (ref 80.0–100.0)
MONOS PCT: 10 %
Monocytes Absolute: 1.1 10*3/uL — ABNORMAL HIGH (ref 0.2–1.0)
NEUTROS ABS: 7.7 10*3/uL — AB (ref 1.4–6.5)
NEUTROS PCT: 68 %
Platelets: 208 10*3/uL (ref 150–440)
RBC: 4.72 MIL/uL (ref 4.40–5.90)
RDW: 13.9 % (ref 11.5–14.5)
WBC: 11.2 10*3/uL — AB (ref 3.8–10.6)

## 2017-06-24 LAB — MAGNESIUM: Magnesium: 1.7 mg/dL (ref 1.7–2.4)

## 2017-06-24 NOTE — Progress Notes (Addendum)
CC: Recurrent appendicitis Subjective: Patient doing very well he is hungry. No abdominal pain no fevers vital signs are stable CT personally reviewed.  Objective: Vital signs in last 24 hours: Temp:  [98.1 F (36.7 C)-98.6 F (37 C)] 98.1 F (36.7 C) (09/13 1432) Pulse Rate:  [61-64] 64 (09/13 1432) Resp:  [20] 20 (09/13 1432) BP: (120-124)/(62-65) 124/65 (09/13 1432) SpO2:  [97 %-100 %] 98 % (09/13 1432) Last BM Date: 06/24/17  Intake/Output from previous day: 09/12 0701 - 09/13 0700 In: 2183.3 [I.V.:1983.3; IV Piggyback:200] Out: 800 [Urine:800] Intake/Output this shift: Total I/O In: 1109 [I.V.:395; IV Piggyback:714] Out: 0   Physical exam: NAd, alert Abd: soft, NT, no peritonitis Ext: well perfused, no edema Neuro: GCS 15, awake, no motor deficits   Lab Results: CBC   Recent Labs  06/23/17 0749 06/24/17 0305  WBC 13.9* 11.2*  HGB 14.6 13.7  HCT 42.6 40.2  PLT 219 208   BMET  Recent Labs  06/23/17 0749 06/24/17 0305  NA 139 140  K 4.1 3.6  CL 107 109  CO2 26 25  GLUCOSE 92 98  BUN 13 15  CREATININE 1.20 0.92  CALCIUM 9.2 8.8*   PT/INR No results for input(s): LABPROT, INR in the last 72 hours. ABG No results for input(s): PHART, HCO3 in the last 72 hours.  Invalid input(s): PCO2, PO2  Studies/Results: Ct Abdomen Pelvis W Contrast  Result Date: 06/23/2017 CLINICAL DATA:  Intermittent abdominal pain. History of ruptured appendicitis and laparoscopic pelvic abscess drainage without removal of the appendix. EXAM: CT ABDOMEN AND PELVIS WITH CONTRAST TECHNIQUE: Multidetector CT imaging of the abdomen and pelvis was performed using the standard protocol following bolus administration of intravenous contrast. CONTRAST:  100mL ISOVUE-300 IOPAMIDOL (ISOVUE-300) INJECTION 61% COMPARISON:  CT abdomen pelvis dated Mar 09, 2017. FINDINGS: Lower chest: No acute abnormality. Hepatobiliary: No focal liver abnormality is seen. No gallstones, gallbladder wall  thickening, or biliary dilatation. Pancreas: Unremarkable. No pancreatic ductal dilatation or surrounding inflammatory changes. Spleen: Normal in size without focal abnormality. Adrenals/Urinary Tract: The adrenal glands are unremarkable. Tiny subcentimeter low-density lesions in both kidneys remain too small to characterize, but are stable. No hydronephrosis. The bladder is unremarkable. Stomach/Bowel: Again seen is an appendicolith at the base of the appendix. The visualized portion of the appendix is dilated to 11 mm, and demonstrates significant wall thickening with mucosal hyperenhancement. There is new prominent fluid and inflammation surrounding the appendix. The appendix appears to terminate in the region of a 2.4 x 1.4 x 1.7 cm rim enhancing fluid collection, concerning for abscess. There is wall thickening of the distal and terminal ileum, likely reactive. The stomach, remaining small bowel, and colon are unremarkable. No bowel obstruction. Vascular/Lymphatic: No significant vascular findings are present. No enlarged abdominal or pelvic lymph nodes. Prominent subcentimeter right lower quadrant mesenteric lymph nodes are likely reactive. Reproductive: Prostate is unremarkable. Other: Small amount of free fluid in the pelvis. No pneumoperitoneum. Musculoskeletal: No acute or significant osseous findings. IMPRESSION: Findings consistent with recurrent appendicitis. The inflamed appendix appears to terminate in the region of a 2.4 x 1.4 x 1.7 cm rim enhancing fluid collection, concerning for abscess. These results were called by telephone at the time of interpretation on 06/23/2017 at 9:16 am to Dr. Nita SickleAROLINA VERONESE , who verbally acknowledged these results. Electronically Signed   By: Obie DredgeWilliam T Derry M.D.   On: 06/23/2017 09:19    Anti-infectives: Anti-infectives    Start     Dose/Rate Route Frequency Ordered Stop  06/23/17 2200  meropenem (MERREM) 1 g in sodium chloride 0.9 % 100 mL IVPB     1 g 200  mL/hr over 30 Minutes Intravenous Every 8 hours 06/23/17 1923     06/23/17 1800  piperacillin-tazobactam (ZOSYN) IVPB 3.375 g  Status:  Discontinued     3.375 g 12.5 mL/hr over 240 Minutes Intravenous Every 8 hours 06/23/17 1011 06/23/17 1907   06/23/17 0930  piperacillin-tazobactam (ZOSYN) IVPB 3.375 g     3.375 g 100 mL/hr over 30 Minutes Intravenous  Once 06/23/17 0919 06/23/17 1013   06/23/17 0927  piperacillin-tazobactam (ZOSYN) 3-0.375 GM/50ML IVPB    Comments:  Bowen, Jane   : cabinet override      06/23/17 0927 06/23/17 2144      Assessment/Plan: Recurrent appendicitis with abscess. Continue antibiotic management. No need for surgical revision. Eventually he will need an interval appendectomy. We'll go ahead and advance his diet and likely DC in the next 24-48 hours  Sterling Big, MD, [facility]  06/24/2017

## 2017-06-24 NOTE — Progress Notes (Signed)
[facility] CLINIC INFECTIOUS DISEASE PROGRESS NOTE Date of Admission:  06/23/2017     ID: Jimmy Leach is a 21 y.o. male with  Recurrent appendicitis Active Problems:   Perforated appendicitis   Subjective: No fevers, less pain  ROS  Eleven systems are reviewed and negative except per hpi  Medications:  Antibiotics Given (last 72 hours)    Date/Time Action Medication Dose Rate   06/23/17 0943 New Bag/Given   piperacillin-tazobactam (ZOSYN) IVPB 3.375 g 3.375 g 100 mL/hr   06/23/17 1726 New Bag/Given   piperacillin-tazobactam (ZOSYN) IVPB 3.375 g 3.375 g 12.5 mL/hr   06/23/17 2156 New Bag/Given   meropenem (MERREM) 1 g in sodium chloride 0.9 % 100 mL IVPB 1 g 200 mL/hr   06/24/17 0500 New Bag/Given   meropenem (MERREM) 1 g in sodium chloride 0.9 % 100 mL IVPB 1 g 200 mL/hr     . enoxaparin (LOVENOX) injection  40 mg Subcutaneous Q24H  . ketorolac  30 mg Intravenous Q6H  . pantoprazole (PROTONIX) IV  40 mg Intravenous QHS    Objective: Vital signs in last 24 hours: Temp:  [98.5 F (36.9 C)-98.6 F (37 C)] 98.5 F (36.9 C) (09/13 0446) Pulse Rate:  [61-64] 64 (09/13 0446) Resp:  [20] 20 (09/13 0446) BP: (120-122)/(62-65) 122/62 (09/13 0446) SpO2:  [97 %-100 %] 97 % (09/13 0446) Constitutional: He is oriented to person, place, and time. He appears well-developed and well-nourished. No distress.  HENT:  Mouth/Throat: Oropharynx is clear and moist. No oropharyngeal exudate.  Cardiovascular: Normal rate, regular rhythm and normal heart sounds. Exam reveals no gallop and no friction rub.  No murmur heard.  Pulmonary/Chest: Effort normal and breath sounds normal. No respiratory distress. He has no wheezes.  Abdominal: Soft. Bowel sounds are normal. He exhibits no distension. Mild ttp RLQ no rb g Lymphadenopathy:  He has no cervical adenopathy.  Neurological: He is alert and oriented to person, place, and time.  Skin: Skin is warm and dry. No rash noted. No erythema.   Psychiatric: He has a normal mood and affect. His behavior is normal. Lab Results  Recent Labs  06/23/17 0749 06/24/17 0305  WBC 13.9* 11.2*  HGB 14.6 13.7  HCT 42.6 40.2  NA 139 140  K 4.1 3.6  CL 107 109  CO2 26 25  BUN 13 15  CREATININE 1.20 0.92    Microbiology: Results for orders placed or performed during the hospital encounter of 01/24/17  Aerobic/Anaerobic Culture (surgical/deep wound)     Status: None   Collection Time: 01/25/17 11:54 AM  Result Value Ref Range Status   Specimen Description ABDOMEN  Final   Special Requests Normal  Final   Gram Stain   Final    ABUNDANT WBC PRESENT, PREDOMINANTLY PMN ABUNDANT GRAM NEGATIVE RODS ABUNDANT GRAM POSITIVE COCCI IN PAIRS IN CHAINS ABUNDANT GRAM POSITIVE RODS Performed at [facility], [address] [place], [place] [address]    Culture   Final    MODERATE ESCHERICHIA COLI Confirmed Extended Spectrum Beta-Lactamase Producer (ESBL) MIXED ANAEROBIC FLORA PRESENT.  CALL LAB IF FURTHER IID REQUIRED.    Report Status 01/29/2017 FINAL  Final   Organism ID, Bacteria ESCHERICHIA COLI  Final      Susceptibility   Escherichia coli - MIC*    AMPICILLIN >=32 RESISTANT Resistant     CEFAZOLIN >=64 RESISTANT Resistant     CEFEPIME RESISTANT Resistant     CEFTAZIDIME RESISTANT Resistant     CEFTRIAXONE >=64  RESISTANT Resistant     CIPROFLOXACIN >=4 RESISTANT Resistant     GENTAMICIN <=1 SENSITIVE Sensitive     IMIPENEM <=0.25 SENSITIVE Sensitive     TRIMETH/SULFA <=20 SENSITIVE Sensitive     AMPICILLIN/SULBACTAM >=32 RESISTANT Resistant     PIP/TAZO 64 INTERMEDIATE Intermediate     Extended ESBL POSITIVE Resistant     * MODERATE ESCHERICHIA COLI  C difficile quick scan w PCR reflex     Status: None   Collection Time: 01/27/17  3:46 PM  Result Value Ref Range Status   C Diff antigen NEGATIVE NEGATIVE Final   C Diff toxin NEGATIVE NEGATIVE Final   C Diff interpretation No C. difficile detected.  Final     Studies/Results: Ct Abdomen Pelvis W Contrast  Result Date: 06/23/2017 CLINICAL DATA:  Intermittent abdominal pain. History of ruptured appendicitis and laparoscopic pelvic abscess drainage without removal of the appendix. EXAM: CT ABDOMEN AND PELVIS WITH CONTRAST TECHNIQUE: Multidetector CT imaging of the abdomen and pelvis was performed using the standard protocol following bolus administration of intravenous contrast. CONTRAST:  100mL ISOVUE-300 IOPAMIDOL (ISOVUE-300) INJECTION 61% COMPARISON:  CT abdomen pelvis dated Mar 09, 2017. FINDINGS: Lower chest: No acute abnormality. Hepatobiliary: No focal liver abnormality is seen. No gallstones, gallbladder wall thickening, or biliary dilatation. Pancreas: Unremarkable. No pancreatic ductal dilatation or surrounding inflammatory changes. Spleen: Normal in size without focal abnormality. Adrenals/Urinary Tract: The adrenal glands are unremarkable. Tiny subcentimeter low-density lesions in both kidneys remain too small to characterize, but are stable. No hydronephrosis. The bladder is unremarkable. Stomach/Bowel: Again seen is an appendicolith at the base of the appendix. The visualized portion of the appendix is dilated to 11 mm, and demonstrates significant wall thickening with mucosal hyperenhancement. There is new prominent fluid and inflammation surrounding the appendix. The appendix appears to terminate in the region of a 2.4 x 1.4 x 1.7 cm rim enhancing fluid collection, concerning for abscess. There is wall thickening of the distal and terminal ileum, likely reactive. The stomach, remaining small bowel, and colon are unremarkable. No bowel obstruction. Vascular/Lymphatic: No significant vascular findings are present. No enlarged abdominal or pelvic lymph nodes. Prominent subcentimeter right lower quadrant mesenteric lymph nodes are likely reactive. Reproductive: Prostate is unremarkable. Other: Small amount of free fluid in the pelvis. No  pneumoperitoneum. Musculoskeletal: No acute or significant osseous findings. IMPRESSION: Findings consistent with recurrent appendicitis. The inflamed appendix appears to terminate in the region of a 2.4 x 1.4 x 1.7 cm rim enhancing fluid collection, concerning for abscess. These results were called by telephone at the time of interpretation on 06/23/2017 at 9:16 am to Dr. Nita SickleAROLINA VERONESE , who verbally acknowledged these results. Electronically Signed   By: Obie DredgeWilliam T Derry M.D.   On: 06/23/2017 09:19    Assessment/Plan: Jimmy Leach is a 21 y.o. male admitted with recurrent appendicitis. He was initially admitted 4/4 with several days abd pain and found to have ruptured appendicitis. Underwent surgery and was dced 4/10 on oral augmentin with JP drains in place. Readmitted 4/15 with recurrent pain and fever and found to have elevated WBC and CT showed a pelvic abscess. Underwent IR placement of drain on 4/16 with cxs growing ESBL E coli.   At that admission he was treated with augmentin and bactrim and had resolution of infection. Failed to follow up for elective appy and now has recurrent infection   Recommendations Cont  meropenem while inpatient and on IV abx At dc would send on oral bactrim DS one  BID  to cover the ESBL E coli, as well as oral augmentin 875 bid as intraabdominal infections are usually polymicrobial Would rec 14 day course with otpt fu with surgery and decision regarding future surgery at that time.  Thank you very much for the consult. Will follow with you.  Mick Sell   06/24/2017, 11:26 AM

## 2017-06-25 MED ORDER — PANTOPRAZOLE SODIUM 40 MG PO TBEC
40.0000 mg | DELAYED_RELEASE_TABLET | Freq: Two times a day (BID) | ORAL | Status: DC
  Administered 2017-06-25 – 2017-06-26 (×3): 40 mg via ORAL
  Filled 2017-06-25 (×3): qty 1

## 2017-06-25 NOTE — Progress Notes (Signed)
CC: Recurrent appendicitis Subjective: Doing well he tolerated diet. No nausea or vomiting. Vital signs stable. Afebrile. Did have some more pain last night.  Objective: Vital signs in last 24 hours: Temp:  [97.5 F (36.4 C)-98.1 F (36.7 C)] 97.7 F (36.5 C) (09/14 1610) Pulse Rate:  [60-75] 60 (09/14 0613) Resp:  [16-20] 16 (09/14 9604) BP: (111-140)/(65-76) 111/65 (09/14 0613) SpO2:  [93 %-98 %] 98 % (09/14 0613) Last BM Date: 06/24/17  Intake/Output from previous day: 09/13 0701 - 09/14 0700 In: 1329 [P.O.:120; I.V.:395; IV Piggyback:814] Out: 0  Intake/Output this shift: No intake/output data recorded.  Physical exam: NAd Abd: soft, Mild TTP LLQ, no peritonitis Exct: well perfused, no edema Neuro: awake. gcs 15. No motor deficits Lab Results: CBC   Recent Labs  06/23/17 0749 06/24/17 0305  WBC 13.9* 11.2*  HGB 14.6 13.7  HCT 42.6 40.2  PLT 219 208   BMET  Recent Labs  06/23/17 0749 06/24/17 0305  NA 139 140  K 4.1 3.6  CL 107 109  CO2 26 25  GLUCOSE 92 98  BUN 13 15  CREATININE 1.20 0.92  CALCIUM 9.2 8.8*   PT/INR No results for input(s): LABPROT, INR in the last 72 hours. ABG No results for input(s): PHART, HCO3 in the last 72 hours.  Invalid input(s): PCO2, PO2  Studies/Results: No results found.  Anti-infectives: Anti-infectives    Start     Dose/Rate Route Frequency Ordered Stop   06/23/17 2200  meropenem (MERREM) 1 g in sodium chloride 0.9 % 100 mL IVPB     1 g 200 mL/hr over 30 Minutes Intravenous Every 8 hours 06/23/17 1923     06/23/17 1800  piperacillin-tazobactam (ZOSYN) IVPB 3.375 g  Status:  Discontinued     3.375 g 12.5 mL/hr over 240 Minutes Intravenous Every 8 hours 06/23/17 1011 06/23/17 1907   06/23/17 0930  piperacillin-tazobactam (ZOSYN) IVPB 3.375 g     3.375 g 100 mL/hr over 30 Minutes Intravenous  Once 06/23/17 0919 06/23/17 1013   06/23/17 0927  piperacillin-tazobactam (ZOSYN) 3-0.375 GM/50ML IVPB    Comments:   Bowen, Jane   : cabinet override      06/23/17 0927 06/23/17 2144      Assessment/Plan: Recurrent diverticulitis with perforation. Improving to medical management. Given some mild pain today I will keep him one more day and repeat a CBC in the morning. No need for surgical revision at this time. Likely DC in a.m. Regular diet    Sterling Big, MD, [facility]  06/25/2017

## 2017-06-25 NOTE — Care Management Note (Signed)
Case Management Note  Patient Details  Name: Jimmy Leach MRN: [id number] Date of Birth: [birth year]   Patient continuing IV antibiotics.  Anticipated discharge tomorrow.  Patient provided applications to [facility] and Medication Management.  Patient was provided applications previous admission, however patient states that he did not complete them and still does not have a PCP.    Documented that patient has been non compliant with follow ups at [facility], and di not complete his doses of oral antibiotics at home. Previously patient was discharge with coupons with from [email or web address].  RNCM follow for discharge medication needs.    Subjective/Objective:                    Action/Plan:   Expected Discharge Date:                  Expected Discharge Plan:  Home/Self Care  In-House Referral:     Discharge planning Services  [facility]igent [facility] Clinic  Post Acute Care Choice:    Choice offered to:     DME Arranged:    DME Agency:     HH Arranged:    HH Agency:     Status of Service:  In process, will continue to follow  If discussed at Long Length of Stay Meetings, dates discussed:    Additional Comments:  Chapman Fitch, RN 06/25/2017, 1:51 PM

## 2017-06-25 NOTE — Progress Notes (Signed)
Pharmacy Antibiotic Note  Jimmy Leach is a 21 y.o. male admitted on 06/23/2017 with intra-abdominal infection and PMH of ESBL infection.  Pharmacy has been consulted for meropenem dosing.  Plan: Will continue meropenem 1g IV every 8 hours.   Height:  (170.2 cm) Weight: 155 lb (70.3 kg) IBW/kg (Calculated) : 66.1  Temp (24hrs), Avg:97.8 F (36.6 C), Min:97.5 F (36.4 C), Max:98.1 F (36.7 C)   Recent Labs Lab 06/23/17 0749 06/24/17 0305  WBC 13.9* 11.2*  CREATININE 1.20 0.92    Estimated Creatinine Clearance: 116.8 mL/min (by C-G formula based on SCr of 0.92 mg/dL).    No Known Allergies  Antimicrobials this admission: 9/12 meropenem >>   Dose adjustments this admission:  Microbiology results: None this admission  Thank you for allowing pharmacy to be a part of this patient's care.  Marty Heck, PharmD, BCPS Clinical Pharmacist 06/25/2017 7:32 AM

## 2017-06-26 LAB — BASIC METABOLIC PANEL
ANION GAP: 6 (ref 5–15)
BUN: 15 mg/dL (ref 6–20)
CO2: 27 mmol/L (ref 22–32)
Calcium: 9.1 mg/dL (ref 8.9–10.3)
Chloride: 109 mmol/L (ref 101–111)
Creatinine, Ser: 1.09 mg/dL (ref 0.61–1.24)
GFR calc Af Amer: >60 mL/min (ref >60)
Glucose, Bld: 104 mg/dL — ABNORMAL HIGH (ref 65–99)
POTASSIUM: 3.9 mmol/L (ref 3.5–5.1)
SODIUM: 142 mmol/L (ref 135–145)

## 2017-06-26 LAB — CBC
HCT: 42.3 % (ref 40.0–52.0)
Hemoglobin: 14.7 g/dL (ref 13.0–18.0)
MCH: 29.6 pg (ref 26.0–34.0)
MCHC: 34.7 g/dL (ref 32.0–36.0)
MCV: 85.5 fL (ref 80.0–100.0)
PLATELETS: 204 10*3/uL (ref 150–440)
RBC: 4.95 MIL/uL (ref 4.40–5.90)
RDW: 13.7 % (ref 11.5–14.5)
WBC: 6.3 10*3/uL (ref 3.8–10.6)

## 2017-06-26 MED ORDER — SULFAMETHOXAZOLE-TRIMETHOPRIM 800-160 MG PO TABS: 1.0000 | ORAL_TABLET | Freq: Two times a day (BID) | ORAL | 0 refills | Status: DC

## 2017-06-26 MED ORDER — AMOXICILLIN-POT CLAVULANATE 875-125 MG PO TABS: 1.0000 | ORAL_TABLET | Freq: Two times a day (BID) | ORAL | 0 refills | Status: AC

## 2017-06-26 MED ORDER — HYDROCODONE-ACETAMINOPHEN 5-325 MG PO TABS: 1.0000 | ORAL_TABLET | ORAL | 0 refills | Status: DC | PRN

## 2017-06-26 MED ORDER — AMOXICILLIN-POT CLAVULANATE 875-125 MG PO TABS
1.0000 | ORAL_TABLET | Freq: Two times a day (BID) | ORAL | 0 refills | Status: DC
Start: 2017-06-26 — End: 2017-06-26

## 2017-06-26 NOTE — Care Management Note (Signed)
Case Management Note  Patient Details  Name: AIJALON KIRTZ MRN: [id number] Date of Birth: Jan 21, 1996  Subjective/Objective:    Has coupons to [employer] for Augmentin and Bactrim.                Action/Plan:   Expected Discharge Date:  06/26/17               Expected Discharge Plan:  Home/Self Care  In-House Referral:     Discharge planning Services  Indigent Health Clinic  Post Acute Care Choice:    Choice offered to:     DME Arranged:    DME Agency:     HH Arranged:    HH Agency:     Status of Service:  In process, will continue to follow  If discussed at Long Length of Stay Meetings, dates discussed:    Additional Comments:  Shellby Schlink A, RN 06/26/2017, 1:00 PM

## 2017-06-26 NOTE — Progress Notes (Signed)
Patient t is being discharge home as per order, discharge instruction provided, iv removed, prescription and discount coupons given to patient at this time.

## 2017-06-26 NOTE — Discharge Summary (Signed)
Patient ID: Jimmy Leach MRN: [id number] DOB/AGE: June 28, 1996 21 y.o.  Admit date: 06/23/2017 Discharge date: 06/26/2017   Discharge Diagnoses:  Active Problems:   Perforated appendicitis   Acute appendicitis with peritoneal abscess    Hospital Course: 20-year-old male well to our practice with a history of recurrent appendicitis that has failed to comply with surgical recommendation. He came to the emergency room with abdominal pain and CT scan showing evidence of appendicitis with a small abscess. This was not amenable for interventional drainage. He was admitted to hospital place nothing by mouth and broad-spectrum antibiotics. Also an ID consultation was performed and they recommended meropenem and Flagyl. He clinically improved with improvement in his white count and abdominal exam. Time of discharge she was ambulating, tolerating a regular diet and pain-free. Physical exam: No acute distress. Awake and alert. Abdomen: Soft, nontender no peritonitis. Extremities well perfused no edema. Patient will be discharged on Bactrim and Augmentin as per ID recommendations. Discussed with the patient detail about importance of interval appendectomy and importance of accurate compliance  Consults: ID  Disposition: 01-Home or Self Care  Discharge Instructions    Call MD for:  difficulty breathing, headache or visual disturbances    Complete by:  As directed    Call MD for:  extreme fatigue    Complete by:  As directed    Call MD for:  hives    Complete by:  As directed    Call MD for:  persistant dizziness or light-headedness    Complete by:  As directed    Call MD for:  persistant nausea and vomiting    Complete by:  As directed    Call MD for:  redness, tenderness, or signs of infection (pain, swelling, redness, odor or green/yellow discharge around incision site)    Complete by:  As directed    Call MD for:  severe uncontrolled pain    Complete by:  As directed    Call MD for:   temperature >100.4    Complete by:  As directed    Diet - low sodium heart healthy    Complete by:  As directed    Increase activity slowly    Complete by:  As directed      Allergies as of 06/26/2017   No Known Allergies     Medication List    TAKE these medications   amoxicillin-clavulanate 875-125 MG tablet Commonly known as:  AUGMENTIN Take 1 tablet by mouth 2 (two) times daily.   HYDROcodone-acetaminophen 5-325 MG tablet Commonly known as:  NORCO/VICODIN Take 1 tablet by mouth every 4 (four) hours as needed for moderate pain.   sulfamethoxazole-trimethoprim 800-160 MG tablet Commonly known as:  BACTRIM DS,SEPTRA DS Take 1 tablet by mouth 2 (two) times daily.            Discharge Care Instructions        Start     Ordered   06/26/17 0000  amoxicillin-clavulanate (AUGMENTIN) 875-125 MG tablet  2 times daily     06/26/17 0756   06/26/17 0000  Diet - low sodium heart healthy     06/26/17 0756   06/26/17 0000  Increase activity slowly     06/26/17 0756   06/26/17 0000  sulfamethoxazole-trimethoprim (BACTRIM DS,SEPTRA DS) 800-160 MG tablet  2 times daily     06/26/17 0756   06/26/17 0000  HYDROcodone-acetaminophen (NORCO/VICODIN) 5-325 MG tablet  Every 4 hours PRN     06/26/17 0756   06/26/17  0000  Call MD for:  temperature >100.4     06/26/17 0756   06/26/17 0000  Call MD for:  persistant nausea and vomiting     06/26/17 0756   06/26/17 0000  Call MD for:  severe uncontrolled pain     06/26/17 0756   06/26/17 0000  Call MD for:  redness, tenderness, or signs of infection (pain, swelling, redness, odor or green/yellow discharge around incision site)     06/26/17 0756   06/26/17 0000  Call MD for:  difficulty breathing, headache or visual disturbances     06/26/17 0756   06/26/17 0000  Call MD for:  hives     06/26/17 0756   06/26/17 0000  Call MD for:  persistant dizziness or light-headedness     06/26/17 0756   06/26/17 0000  Call MD for:  extreme fatigue      06/26/17 0756     Follow-up Information    Ricarda Frame, MD Follow up in 2 week(s).   Specialty:  General Surgery Contact information: [address] [place] [address] [phone]            Sterling Big, MD FACS

## 2017-06-28 ENCOUNTER — Telehealth: Payer: Self-pay

## 2017-06-28 NOTE — Telephone Encounter (Signed)
Patient called requesting a letter for the days that he was at the hospital.  Letter was given to patient.   Follow up appointment was scheduled as well.

## 2017-07-15 ENCOUNTER — Ambulatory Visit (INDEPENDENT_AMBULATORY_CARE_PROVIDER_SITE_OTHER): Payer: Self-pay | Admitting: General Surgery

## 2017-07-15 ENCOUNTER — Encounter: Payer: Self-pay | Admitting: General Surgery

## 2017-07-15 VITALS — BP 132/78 | HR 66 | Temp 98.3°F | Ht 67.0 in | Wt 156.4 lb

## 2017-07-15 DIAGNOSIS — K3533 Acute appendicitis with perforation and localized peritonitis, with abscess: Secondary | ICD-10-CM

## 2017-07-15 NOTE — Progress Notes (Signed)
Outpatient Surgical Follow Up  07/15/2017  Jimmy Leach is an 21 y.o. male.   Chief Complaint  Patient presents with  . Hospitalization Follow-up    Acute Appendicitis with Peritoneal Abscess    HPI: 21 year old male who is well known to the surgery service returns to clinic for follow-up of recent recurrent periappendiceal abscess. Patient reports he still on antibiotics but that the pain and discomfort has completely resolved. He denies any fevers, chills, nausea, vomiting, chest pain, shortness of breath, diarrhea, constipation. He states he's now ready to consider having surgery.  Past Medical History:  Diagnosis Date  . Acute appendicitis with appendiceal abscess 01/2017   required bilateral percutaneous drains  . ADHD   . Appendicitis   . Polysubstance abuse (HCC) 04/20/2014   Overview:  Cocaine, marijuana, Molly, mushrooms  . Ruptured appendicitis     Past Surgical History:  Procedure Laterality Date  . LAPAROSCOPIC APPENDECTOMY N/A 01/14/2017   Procedure: LAPAROSCOPIC DRAINAGE OF PELVIC ABSCESS;  Surgeon: Ricarda Frame, MD;  Location: [facility] ORS;  Service: General;  Laterality: N/A;    Family History  Problem Relation Age of Onset  . Healthy Mother     Social History:  reports that he has never smoked. He has never used smokeless tobacco. He reports that he drinks alcohol. He reports that he uses drugs, including Marijuana and Other-see comments.  Allergies: No Known Allergies  Medications reviewed.    ROS A multipoint review of systems was completed, all pertinent positives and negatives are documented within the history of present illness and the remainder are negative   BP 132/78   Pulse 66   Temp 98.3 F (36.8 C) (Oral)   Ht  (1.702 m)   Wt 70.9 kg (156 lb 6.4 oz)   BMI 24.50 kg/m   Physical Exam Gen.: No acute distress Neck: Supple and nontender Chest: Clear to auscultation  heart: Regular rate and rhythm Abdomen: Soft, nontender,  nondistended. Well-healed prior laparoscopic incision sites. Extremities: His alternatives well.    No results found for this or any previous visit (from the past 48 hour(s)). No results found.  Assessment/Plan:  1. 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 Discussed the problem as well as the treatment that includes a laparoscopic appendectomy. Discussed the procedure in detail to include the risks, benefits, alternatives. He voiced understanding and desires to proceed. Discussed appropriate timing for this intervention as 6 weeks after his most recent infection. That would make the ideal surgery date on her after November 1. We will tentatively plan for surgery on November 1 for laparoscopic appendectomy. He voiced understanding that he has a high risk of being converted to open and that if he has an open procedure he'll need to spend additional time in the hospital. All questions answered to the patient's satisfaction.  A total of 25 minutes was used on this encounter improved 50% of a user counseling and coordination of care.     Ricarda Frame, MD FACS General Surgeon  07/15/2017,10:22 AM

## 2017-07-15 NOTE — Patient Instructions (Signed)
We have scheduled you for an elective Appendectomy. We have scheduled your surgery for 08/12/17 with Dr. Tonita Cong at [facility]. Please see the following information regarding your surgery.   If we are able to do the surgery as planned with the smaller incisions, you will go home the same day. If we need to make a larger incision you will most likely be in the hospital for 3-4 days.  Typically, our patients are out of work 1-2 weeks following the surgery and may return with a lifting restriction of no more than 15 lbs. For a complete 6 weeks following surgery. If you need [place] to fill out any FMLA or disability paperwork for your employer, please submit that to our office as soon as you can. This is completed within 3 days of your scheduled surgery and requires a $25.00 one time fee that can be paid over the phone or at our front desk.  Also, please review the [facility]) Pre-Care sheet for further information regarding your surgery. If you have any questions, please do not hesitate to contact our office.  Laparoscopic Appendectomy, Adult, Care After Refer to this sheet in the next few weeks. These instructions provide you with information about caring for yourself after your procedure. Your health care provider may also give you more specific instructions. Your treatment has been planned according to current medical practices, but problems sometimes occur. Call your health care provider if you have any problems or questions after your procedure. What can I expect after the procedure? After the procedure, it is common to have:  A decrease in your energy level.  Mild pain in the area where the surgical cuts (incisions) were made.  Constipation. This can be caused by pain medicine and a decrease in your activity.  Follow these instructions at home: Medicines  Take over-the-counter and prescription medicines only as told by your health care provider.  Do not drive for 24 hours if you received a sedative.  Do  not drive or operate heavy machinery while taking prescription pain medicine.  If you were prescribed an antibiotic medicine, take it as told by your health care provider. Do not stop taking the antibiotic even if you start to feel better. Activity  For 3 weeks or as long as told by your health care provider: ? Do not lift anything that is heavier than 10 pounds (4.5 kg). ? Do not play contact sports.  Gradually return to your normal activities. Ask your health care provider what activities are safe for you. Bathing  Keep your incisions clean and dry. Clean them as often as told by your health care provider: ? Gently wash the incisions with soap and water. ? Rinse the incisions with water to remove all soap. ? Pat the incisions dry with a clean towel. Do not rub the incisions.  You may take showers after 48 hours.  Do not take baths, swim, or use hot tubs for 2 weeks or as told by your health care provider. Incision care  Follow instructions from your healthcare provider about how to take care of your incisions. Make sure you: ? Wash your hands with soap and water before you change your bandage (dressing). If soap and water are not available, use hand sanitizer. ? Change your dressing as told by your health care provider. ? Leave stitches (sutures), skin glue, or adhesive strips in place. These skin closures may need to stay in place for 2 weeks or longer. If adhesive strip edges start to loosen  and curl up, you may trim the loose edges. Do not remove adhesive strips completely unless your health care provider tells you to do that.  Check your incision areas every day for signs of infection. Check for: ? More redness, swelling, or pain. ? More fluid or blood. ? Warmth. ? Pus or a bad smell. Other Instructions  If you were sent home with a drain, follow instructions from your health care provider about how to care for the drain and how to empty it.  Take deep breaths. This helps to  prevent your lungs from becoming inflamed.  To relieve and prevent constipation: ? Drink plenty of fluids. ? Eat plenty of fruits and vegetables.  Keep all follow-up visits as told by your health care provider. This is important. Contact a health care provider if:  You have more redness, swelling, or pain around an incision.  You have more fluid or blood coming from an incision.  Your incision feels warm to the touch.  You have pus or a bad smell coming from an incision or dressing.  Your incision edges break open after your sutures have been removed.  You have increasing pain in your shoulders.  You feel dizzy or you faint.  You develop shortness of breath.  You keep feeling nauseous or vomiting.  You have diarrhea or you cannot control your bowel functions.  You lose your appetite.  You develop swelling or pain in your legs. Get help right away if:  You have a fever.  You develop a rash.  You have difficulty breathing.  You have sharp pains in your chest. This information is not intended to replace advice given to you by your health care provider. Make sure you discuss any questions you have with your health care provider. Document Released: 09/28/2005 Document Revised: 02/28/2016 Document Reviewed: 03/18/2015 Elsevier Interactive Patient Education  2017 [employer].

## 2017-07-19 ENCOUNTER — Telehealth: Payer: Self-pay | Admitting: General Surgery

## 2017-07-19 NOTE — Telephone Encounter (Signed)
Patient returned call at this time. I let patient know that he should receive a phone call on 10/25 between 9-1 for his pre op call. Also reminded patient to call number on his blue sheet between 1-3 to get his arrival time for his surgery on 11/1. Patient verbalized understanding.

## 2017-07-19 NOTE — Telephone Encounter (Signed)
I have called patient to discuss surgery information. No answer. I have left a message for patient to call back.   Please advise Pt of pre op date/time and sx date. Sx: 08/12/17 with Dr Michela Pitcher laparoscopic appendectomy. Possible open.  Pre op: 08/05/17 between 9-1:00pm--Phone.   Please make Patient aware to call [phone], between 1-3:00pm the day before surgery, to find out what time to arrive.

## 2017-08-05 ENCOUNTER — Encounter
Admission: RE | Admit: 2017-08-05 | Discharge: 2017-08-05 | Disposition: A | Discharge status: Home / Self Care | Payer: Self-pay | Source: Ambulatory Visit | Attending: General Surgery | Admitting: General Surgery

## 2017-08-05 NOTE — Patient Instructions (Signed)
Your procedure is scheduled on: 08/12/17 Report to Day Surgery.[facility] SECOND FLOOR To find out your arrival time please call [phone] between 1PM - 3PM on 08/11/17.  Remember: Instructions that are not followed completely may result in serious medical risk, up to and including death, or upon the discretion of your surgeon and anesthesiologist your surgery may need to be rescheduled.     _X__ 1. Do not eat food after midnight the night before your procedure.                 No gum chewing or hard candies. You may drink clear liquids up to 2 hours                 before you are scheduled to arrive for your surgery- DO not drink clear                 liquids within 2 hours of the start of your surgery.                 Clear Liquids include:  water, apple juice without pulp, clear carbohydrate                 drink such as Clearfast of Gartorade, Black Coffee or Tea (Do not add                 anything to coffee or tea).     _X__ 2.  No Alcohol for 24 hours before or after surgery.   _X__ 3.  Do Not Smoke or use e-cigarettes For 24 Hours Prior to Your Surgery.                 Do not use any chewable tobacco products for at least 6 hours prior to                 surgery.  ____  4.  Bring all medications with you on the day of surgery if instructed.   __X__  5.  Notify your doctor if there is any change in your medical condition      (cold, fever, infections).     Do not wear jewelry, make-up, hairpins, clips or nail polish. Do not wear lotions, powders, or perfumes. You may wear deodorant. Do not shave 48 hours prior to surgery. Men may shave face and neck. Do not bring valuables to the hospital.    [facility] any belongings or valuables.  Contacts, dentures or bridgework may not be worn into surgery. Leave your suitcase in the car. After surgery it may be brought to your room. For patients admitted to the hospital, discharge time  is determined by your treatment team.   Patients discharged the day of surgery will not be allowed to drive home.   ____ Take these medicines the morning of surgery with A SIP OF WATER:    1. NONE  2.   3.   4.  5.  6.  ____ Fleet Enema (as directed)   ____ Use CHG Soap as directed  ____ Use inhalers on the day of surgery  ____ Stop metformin 2 days prior to surgery    ____ Take 1/2 of usual insulin dose the night before surgery. No insulin the morning          of surgery.   ____ Stop Coumadin/Plavix/aspirin on  ____ Stop Anti-inflammatories on    ____ Stop supplements until after surgery.    ____  Bring C-Pap to the hospital.

## 2017-08-11 MED ORDER — DEXTROSE 5 % IV SOLN
2.0000 g | INTRAVENOUS | Status: DC
  Filled 2017-08-11: qty 2

## 2017-08-12 ENCOUNTER — Ambulatory Visit: Payer: Self-pay | Admitting: Anesthesiology

## 2017-08-12 ENCOUNTER — Encounter
Admission: RE | Disposition: A | Discharge status: Home / Self Care | Payer: Self-pay | Source: Ambulatory Visit | Attending: General Surgery

## 2017-08-12 ENCOUNTER — Ambulatory Visit
Admission: RE | Admit: 2017-08-12 | Discharge: 2017-08-12 | Disposition: A | Discharge status: Home / Self Care | Payer: Self-pay | Source: Ambulatory Visit | Attending: General Surgery | Admitting: General Surgery

## 2017-08-12 ENCOUNTER — Encounter: Payer: Self-pay | Admitting: *Deleted

## 2017-08-12 DIAGNOSIS — F909 Attention-deficit hyperactivity disorder, unspecified type: Secondary | ICD-10-CM | POA: Insufficient documentation

## 2017-08-12 DIAGNOSIS — F1910 Other psychoactive substance abuse, uncomplicated: Secondary | ICD-10-CM | POA: Insufficient documentation

## 2017-08-12 DIAGNOSIS — K3580 Unspecified acute appendicitis: Secondary | ICD-10-CM | POA: Insufficient documentation

## 2017-08-12 DIAGNOSIS — Z538 Procedure and treatment not carried out for other reasons: Secondary | ICD-10-CM | POA: Insufficient documentation

## 2017-08-12 LAB — URINE DRUG SCREEN, QUALITATIVE (ARMC ONLY)
AMPHETAMINES, UR SCREEN: NOT DETECTED
Barbiturates, Ur Screen: NOT DETECTED
Benzodiazepine, Ur Scrn: NOT DETECTED
CANNABINOID 50 NG, UR ~~LOC~~: POSITIVE — AB
COCAINE METABOLITE, UR ~~LOC~~: POSITIVE — AB
MDMA (Ecstasy)Ur Screen: NOT DETECTED
METHADONE SCREEN, URINE: NOT DETECTED
OPIATE, UR SCREEN: NOT DETECTED
PHENCYCLIDINE (PCP) UR S: NOT DETECTED
TRICYCLIC, UR SCREEN: NOT DETECTED

## 2017-08-12 SURGERY — APPENDECTOMY, LAPAROSCOPIC
Anesthesia: General

## 2017-08-12 MED ORDER — GLYCOPYRROLATE 0.2 MG/ML IJ SOLN: INTRAMUSCULAR | Status: DC | PRN

## 2017-08-12 MED ORDER — MIDAZOLAM HCL 2 MG/2ML IJ SOLN
INTRAMUSCULAR | Status: AC
  Filled 2017-08-12: qty 2

## 2017-08-12 MED ORDER — FAMOTIDINE 20 MG PO TABS
ORAL_TABLET | ORAL | Status: AC
  Filled 2017-08-12: qty 1

## 2017-08-12 MED ORDER — BUPIVACAINE-EPINEPHRINE (PF) 0.25% -1:200000 IJ SOLN
INTRAMUSCULAR | Status: AC
  Filled 2017-08-12: qty 30

## 2017-08-12 MED ORDER — MIDAZOLAM HCL 2 MG/2ML IJ SOLN: INTRAMUSCULAR | Status: DC | PRN

## 2017-08-12 MED ORDER — CHLORHEXIDINE GLUCONATE CLOTH 2 % EX PADS: 6.0000 | MEDICATED_PAD | Freq: Once | CUTANEOUS | Status: DC

## 2017-08-12 MED ORDER — LACTATED RINGERS IV SOLN: INTRAVENOUS | Status: DC

## 2017-08-12 MED ORDER — ROCURONIUM BROMIDE 50 MG/5ML IV SOLN
INTRAVENOUS | Status: AC
  Filled 2017-08-12: qty 1

## 2017-08-12 MED ORDER — FENTANYL CITRATE (PF) 100 MCG/2ML IJ SOLN
INTRAMUSCULAR | Status: AC
  Filled 2017-08-12: qty 2

## 2017-08-12 MED ORDER — PROPOFOL 10 MG/ML IV BOLUS
INTRAVENOUS | Status: AC
  Filled 2017-08-12: qty 20

## 2017-08-12 MED ORDER — LIDOCAINE HCL (PF) 1 % IJ SOLN
INTRAMUSCULAR | Status: AC
  Filled 2017-08-12: qty 30

## 2017-08-12 MED ORDER — LIDOCAINE HCL (PF) 2 % IJ SOLN
INTRAMUSCULAR | Status: AC
  Filled 2017-08-12: qty 10

## 2017-08-12 MED ORDER — FAMOTIDINE 20 MG PO TABS: 20.0000 mg | ORAL_TABLET | Freq: Once | ORAL | Status: DC

## 2017-08-12 SURGICAL SUPPLY — 49 items
ADHESIVE MASTISOL STRL (MISCELLANEOUS) ×3 IMPLANT
APPLIER CLIP 5 13 M/L LIGAMAX5 (MISCELLANEOUS)
BLADE SURG SZ11 CARB STEEL (BLADE) ×3 IMPLANT
BULB RESERV EVAC DRAIN JP 100C (MISCELLANEOUS) IMPLANT
CANISTER SUCT 1200ML W/VALVE (MISCELLANEOUS) ×3 IMPLANT
CANISTER SUCT 3000ML PPV (MISCELLANEOUS) ×3 IMPLANT
CHLORAPREP W/TINT 26ML (MISCELLANEOUS) ×3 IMPLANT
CLIP APPLIE 5 13 M/L LIGAMAX5 (MISCELLANEOUS) IMPLANT
CLOSURE WOUND 1/2 X4 (GAUZE/BANDAGES/DRESSINGS) ×1
CUTTER FLEX LINEAR 45M (STAPLE) ×3 IMPLANT
DRAIN CHANNEL JP 19F (MISCELLANEOUS) IMPLANT
DRAPE LAPAROTOMY 100X77 ABD (DRAPES) ×3 IMPLANT
DRSG TEGADERM 2-3/8X2-3/4 SM (GAUZE/BANDAGES/DRESSINGS) ×9 IMPLANT
DRSG TELFA 4X3 1S NADH ST (GAUZE/BANDAGES/DRESSINGS) ×3 IMPLANT
ELECT REM PT RETURN 9FT ADLT (ELECTROSURGICAL) ×3
ELECTRODE REM PT RTRN 9FT ADLT (ELECTROSURGICAL) ×1 IMPLANT
GAUZE SPONGE 4X4 12PLY STRL (GAUZE/BANDAGES/DRESSINGS) ×3 IMPLANT
GLOVE BIO SURGEON STRL SZ7.5 (GLOVE) ×3 IMPLANT
GLOVE INDICATOR 8.0 STRL GRN (GLOVE) ×3 IMPLANT
GOWN STRL REUS W/ TWL LRG LVL3 (GOWN DISPOSABLE) ×2 IMPLANT
GOWN STRL REUS W/TWL LRG LVL3 (GOWN DISPOSABLE) ×4
IRRIGATION STRYKERFLOW (MISCELLANEOUS) IMPLANT
IRRIGATOR STRYKERFLOW (MISCELLANEOUS)
IV NS 1000ML (IV SOLUTION) ×2
IV NS 1000ML BAXH (IV SOLUTION) ×1 IMPLANT
KIT RM TURNOVER STRD PROC AR (KITS) ×3 IMPLANT
LABEL OR SOLS (LABEL) ×3 IMPLANT
NEEDLE HYPO 25X1 1.5 SAFETY (NEEDLE) ×3 IMPLANT
NEEDLE VERESS 14GA 120MM (NEEDLE) ×3 IMPLANT
NS IRRIG 500ML POUR BTL (IV SOLUTION) ×3 IMPLANT
PACK BASIN MINOR ARMC (MISCELLANEOUS) ×3 IMPLANT
PACK LAP CHOLECYSTECTOMY (MISCELLANEOUS) ×3 IMPLANT
POUCH SPECIMEN RETRIEVAL 10MM (ENDOMECHANICALS) ×3 IMPLANT
RELOAD 45 VASCULAR/THIN (ENDOMECHANICALS) ×3 IMPLANT
RELOAD STAPLE TA45 3.5 REG BLU (ENDOMECHANICALS) ×3 IMPLANT
SCALPEL HARMONIC ACE (MISCELLANEOUS) ×3 IMPLANT
SLEEVE ENDOPATH XCEL 5M (ENDOMECHANICALS) ×3 IMPLANT
SPONGE LAP 18X18 5 PK (GAUZE/BANDAGES/DRESSINGS) ×3 IMPLANT
STRIP CLOSURE SKIN 1/2X4 (GAUZE/BANDAGES/DRESSINGS) ×2 IMPLANT
SUT MNCRL 4-0 (SUTURE) ×2
SUT MNCRL 4-0 27XMFL (SUTURE) ×1
SUT SILK 2 0 SH (SUTURE) ×3 IMPLANT
SUT VICRYL 0 UR6 27IN ABS (SUTURE) IMPLANT
SUTURE MNCRL 4-0 27XMF (SUTURE) ×1 IMPLANT
SWAB DUAL CULTURE TRANS RED ST (MISCELLANEOUS) ×3 IMPLANT
TRAY FOLEY W/METER SILVER 16FR (SET/KITS/TRAYS/PACK) ×3 IMPLANT
TROCAR XCEL 12X100 BLDLESS (ENDOMECHANICALS) ×3 IMPLANT
TROCAR XCEL NON-BLD 5MMX100MML (ENDOMECHANICALS) ×3 IMPLANT
TUBING INSUFFLATOR HI FLOW (MISCELLANEOUS) ×3 IMPLANT

## 2017-08-12 NOTE — Anesthesia Preprocedure Evaluation (Signed)
Anesthesia Evaluation  Patient identified by MRN, date of birth, ID band Patient awake    Reviewed: Allergy & Precautions, NPO status , Patient's Chart, lab work & pertinent test results, reviewed documented beta blocker date and time   Airway Mallampati: III  TM Distance: >3 FB     Dental  (+) Chipped   Pulmonary Current Smoker,           Cardiovascular      Neuro/Psych PSYCHIATRIC DISORDERS Anxiety    GI/Hepatic   Endo/Other    Renal/GU      Musculoskeletal   Abdominal   Peds  Hematology   Anesthesia Other Findings Drug abuse.  Reproductive/Obstetrics                             Anesthesia Physical Anesthesia Plan  ASA: III  Anesthesia Plan: General   Post-op Pain Management:    Induction: Intravenous  PONV Risk Score and Plan:   Airway Management Planned: Oral ETT  Additional Equipment:   Intra-op Plan:   Post-operative Plan:   Informed Consent: I have reviewed the patients History and Physical, chart, labs and discussed the procedure including the risks, benefits and alternatives for the proposed anesthesia with the patient or authorized representative who has indicated his/her understanding and acceptance.     Plan Discussed with: CRNA  Anesthesia Plan Comments:         Anesthesia Quick Evaluation

## 2017-08-12 NOTE — OR Nursing (Signed)
UDS sent upon arrival to [facility]. Patient reports smoking marijuana this am and using cocaine two days ago. Dr. Tonita CongWoodham notified and awaiting the results of the UDS.

## 2017-08-12 NOTE — OR Nursing (Signed)
UDS is positive for cocaine and marijuana. Case cancelled per Dr. Maisie Fushomas and Dr. Tonita CongWoodham.

## 2017-08-12 NOTE — Progress Notes (Signed)
  Patient's case cancelled today due to being positive for poly-substances.  He will be contacted by clinic later today to schedule outpatient follow up.  Jimmy Frameharles Ester Mabe, MD [facility] General Surgeon [facility]  Day [facility] [phone]([id number]) [phone] Night [facility] [phone](7p-7a) [phone]

## 2017-08-12 NOTE — Care Management (Signed)
CASE CANCELLED - positive for cocaine.

## 2017-08-19 ENCOUNTER — Ambulatory Visit: Payer: Self-pay | Admitting: General Surgery

## 2017-08-23 ENCOUNTER — Telehealth: Payer: Self-pay | Admitting: General Surgery

## 2017-08-23 NOTE — Telephone Encounter (Signed)
Spoke with the patient to r/s his no showed appointment on 08/19/17, patient said he would just call when he is ready to r/s this appointment.

## 2018-01-05 ENCOUNTER — Other Ambulatory Visit: Payer: Self-pay

## 2018-01-05 ENCOUNTER — Emergency Department
Admission: EM | Admit: 2018-01-05 | Discharge: 2018-01-05 | Disposition: A | Discharge status: Home / Self Care | Payer: Self-pay | Attending: Emergency Medicine | Admitting: Emergency Medicine

## 2018-01-05 DIAGNOSIS — R1110 Vomiting, unspecified: Secondary | ICD-10-CM | POA: Insufficient documentation

## 2018-01-05 DIAGNOSIS — R1031 Right lower quadrant pain: Secondary | ICD-10-CM | POA: Insufficient documentation

## 2018-01-05 DIAGNOSIS — Z5321 Procedure and treatment not carried out due to patient leaving prior to being seen by health care provider: Secondary | ICD-10-CM | POA: Insufficient documentation

## 2018-01-05 LAB — COMPREHENSIVE METABOLIC PANEL
ALBUMIN: 4.5 g/dL (ref 3.5–5.0)
ALK PHOS: 69 U/L (ref 38–126)
ALT: 22 U/L (ref 17–63)
ANION GAP: 10 (ref 5–15)
AST: 33 U/L (ref 15–41)
BUN: 13 mg/dL (ref 6–20)
CALCIUM: 9.5 mg/dL (ref 8.9–10.3)
CHLORIDE: 101 mmol/L (ref 101–111)
CO2: 25 mmol/L (ref 22–32)
Creatinine, Ser: 1.02 mg/dL (ref 0.61–1.24)
GFR calc Af Amer: >60 mL/min (ref >60)
GFR calc non Af Amer: >60 mL/min (ref >60)
GLUCOSE: 110 mg/dL — AB (ref 65–99)
POTASSIUM: 3.8 mmol/L (ref 3.5–5.1)
SODIUM: 136 mmol/L (ref 135–145)
Total Bilirubin: 1.2 mg/dL (ref 0.3–1.2)
Total Protein: 8 g/dL (ref 6.5–8.1)

## 2018-01-05 LAB — URINALYSIS, COMPLETE (UACMP) WITH MICROSCOPIC
BACTERIA UA: NONE SEEN
Bilirubin Urine: NEGATIVE
Glucose, UA: NEGATIVE mg/dL
Hgb urine dipstick: NEGATIVE
KETONES UR: NEGATIVE mg/dL
Leukocytes, UA: NEGATIVE
Nitrite: NEGATIVE
PROTEIN: NEGATIVE mg/dL
SQUAMOUS EPITHELIAL / LPF: NONE SEEN
Specific Gravity, Urine: 1.01 (ref 1.005–1.030)
pH: 7 (ref 5.0–8.0)

## 2018-01-05 LAB — CBC
HEMATOCRIT: 46.8 % (ref 40.0–52.0)
HEMOGLOBIN: 15.7 g/dL (ref 13.0–18.0)
MCH: 29.9 pg (ref 26.0–34.0)
MCHC: 33.6 g/dL (ref 32.0–36.0)
MCV: 89 fL (ref 80.0–100.0)
Platelets: 278 10*3/uL (ref 150–440)
RBC: 5.26 MIL/uL (ref 4.40–5.90)
RDW: 13.2 % (ref 11.5–14.5)
WBC: 7.8 10*3/uL (ref 3.8–10.6)

## 2018-01-05 LAB — LIPASE, BLOOD: LIPASE: 29 U/L (ref 11–51)

## 2018-01-05 NOTE — ED Notes (Signed)
Patient came up to the desk and states he needs to be somewhere at 3pm so he needed to leave.  This RN encouraged patient to stay but patient states, "I really can't be late."  Patient states he would try to come back later in the day.  Patient states he does not feel any better or worse than when he came to the ED.

## 2018-01-05 NOTE — ED Triage Notes (Addendum)
Pt states that he woke up this morning vomiting (vomited x4 since this am) - he states he had his appendix removed one year ago and "they left infection in me and I never went back for my follow up" - he is now c/o right lower abd pain - pt smoked pot prior to coming to ED - his main complaint is now nasal congestion and body aches

## 2018-01-07 ENCOUNTER — Telehealth: Payer: Self-pay | Admitting: Emergency Medicine

## 2018-01-07 NOTE — Telephone Encounter (Signed)
Called patient due to lwot to inquire about condition and follow up plans. Says he feels better and is at work. Explained that he can always return if needed.

## 2018-03-12 IMAGING — CT CT CORE BIOPSY RENAL
2 of 4 series · 12 of 32 positions shown, 18 images · non-contrast
Comparison: none

INDICATION: Postop abscess, perforated appendicitis

[Series 2: i-spiral 5.0 b30f · axial · 0.74mm/px · z∈[-224,-203]mm · 2 of 40 slices shown (1 of 2)]
[im 6/40  soft-tissue]
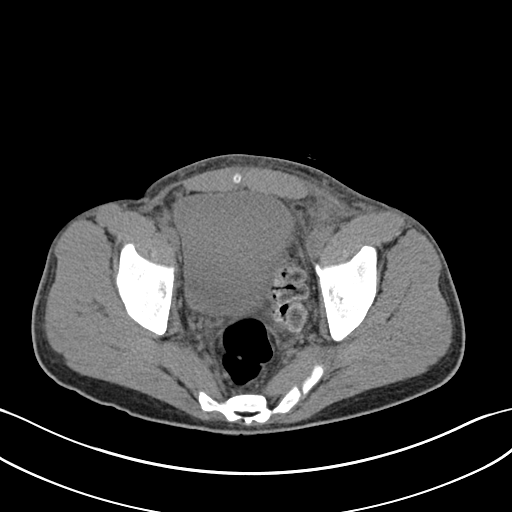
[im 12/40  soft-tissue]
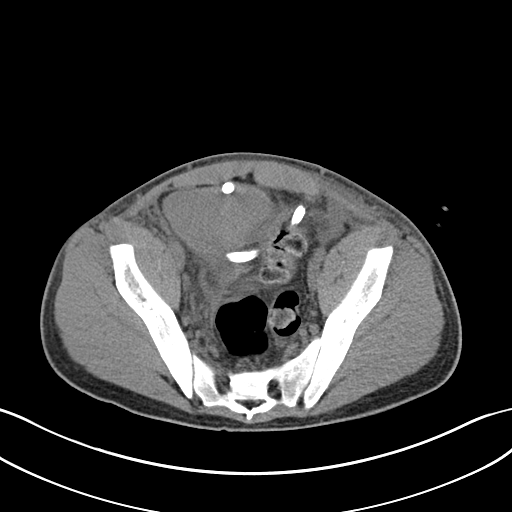

[Series 4: i-spiral 5.0 b30f · axial · 0.74mm/px · z∈[-365,-194]mm · 10 of 61 slices shown, 16 images (2 of 2)]
[im 6/61  soft-tissue]
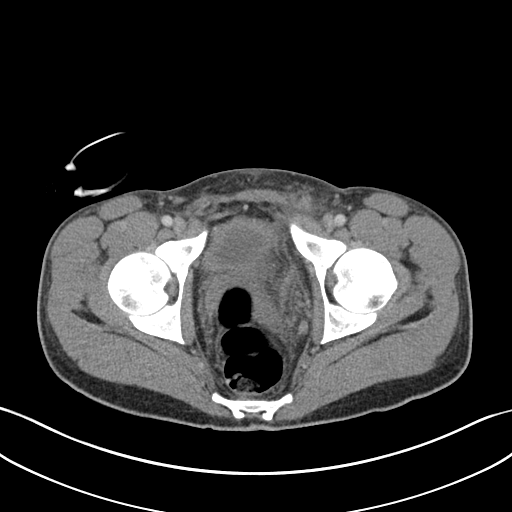
[im 6/61  bone]
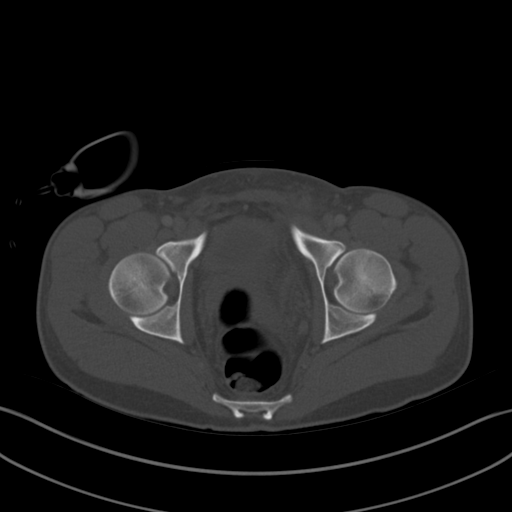
[im 11/61  soft-tissue]
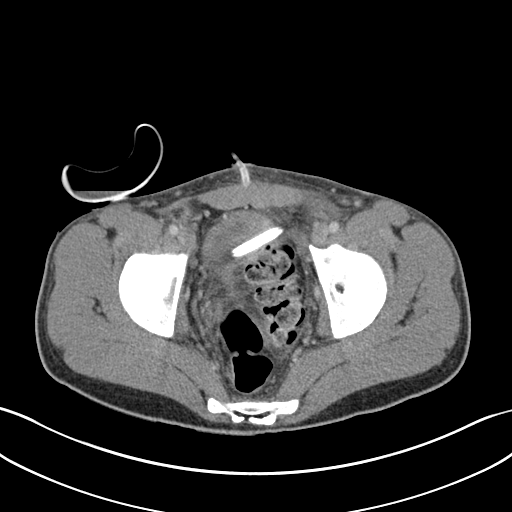
[im 17/61  soft-tissue]
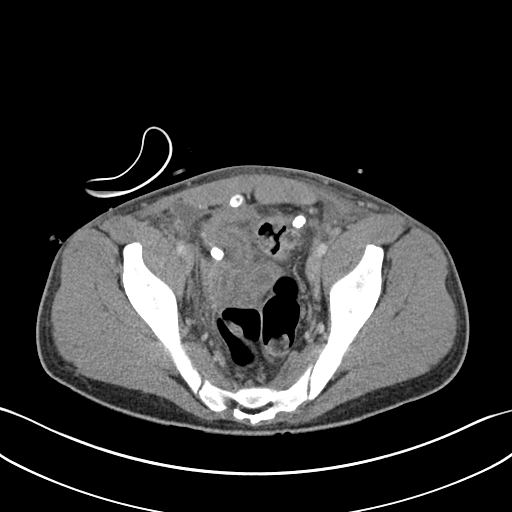
[im 22/61  soft-tissue]
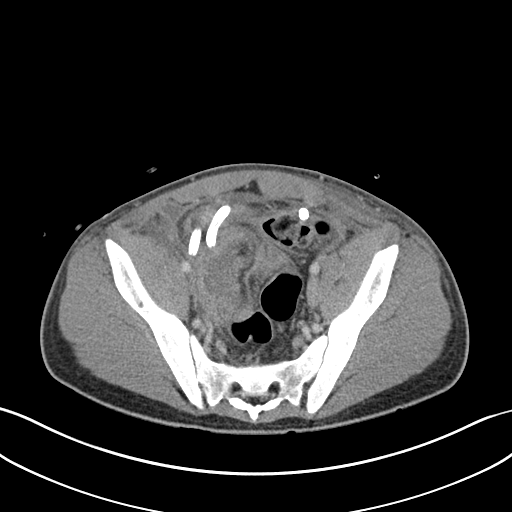
[im 28/61  soft-tissue]
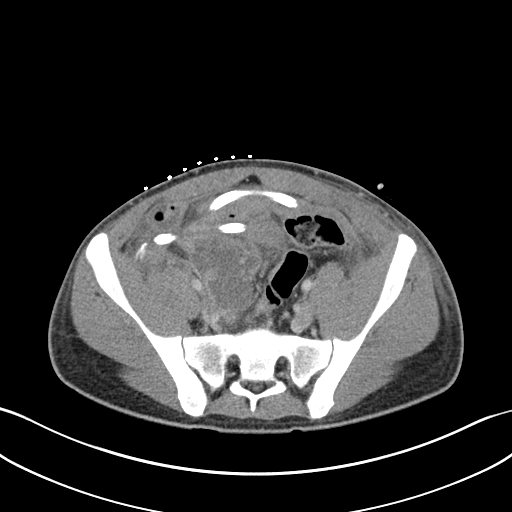
[im 33/61  soft-tissue]
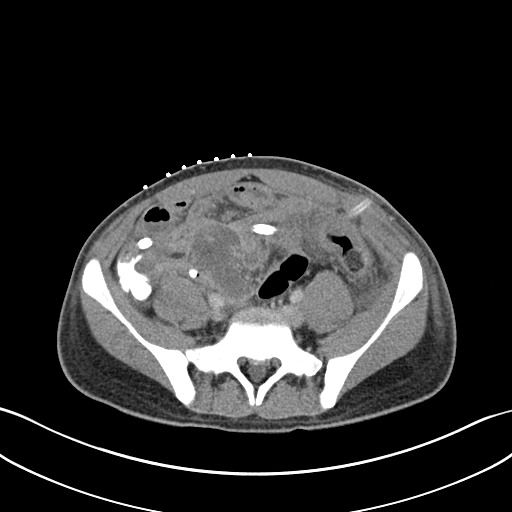
[im 39/61  soft-tissue]
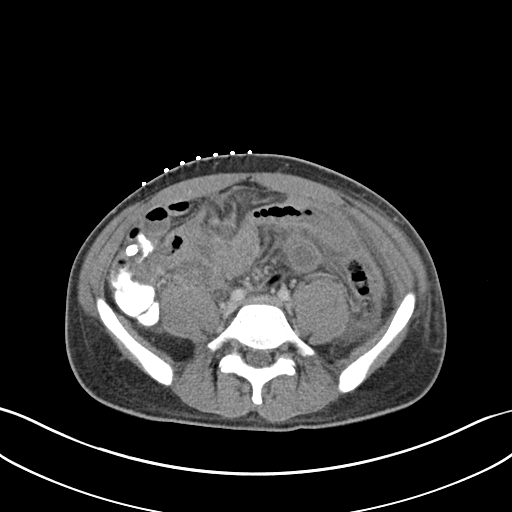
[im 39/61  lung]
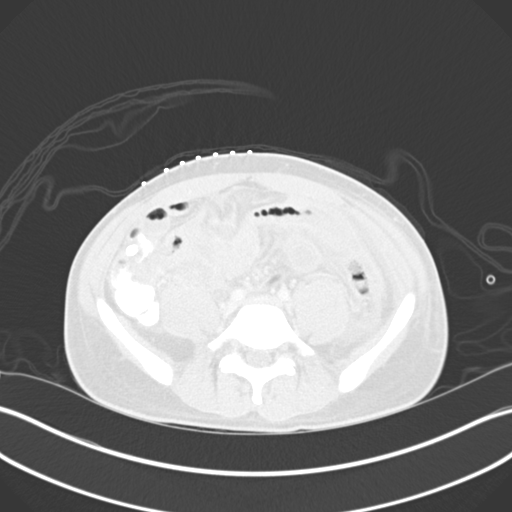
[im 44/61  soft-tissue]
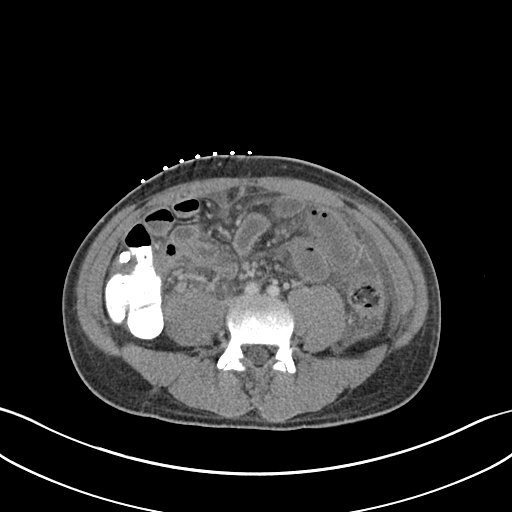
[im 44/61  lung]
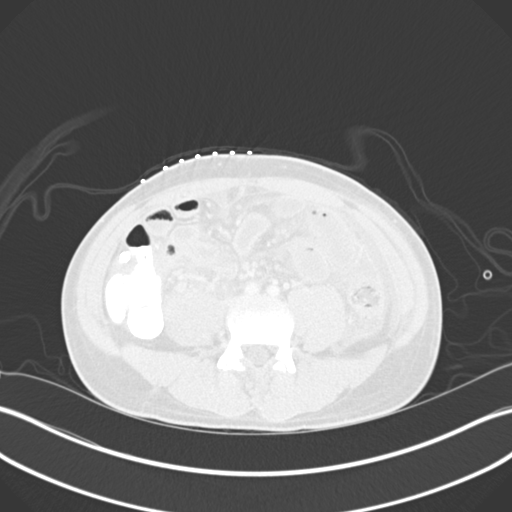
[im 50/61  soft-tissue]
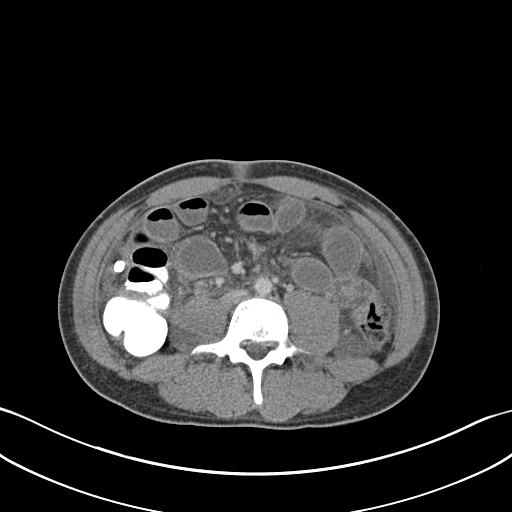
[im 50/61  lung]
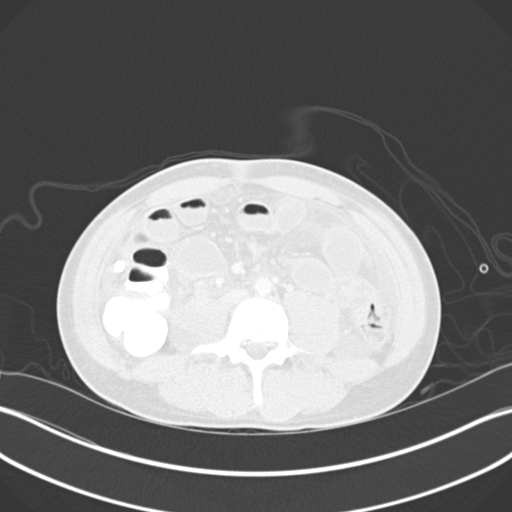
[im 50/61  bone]
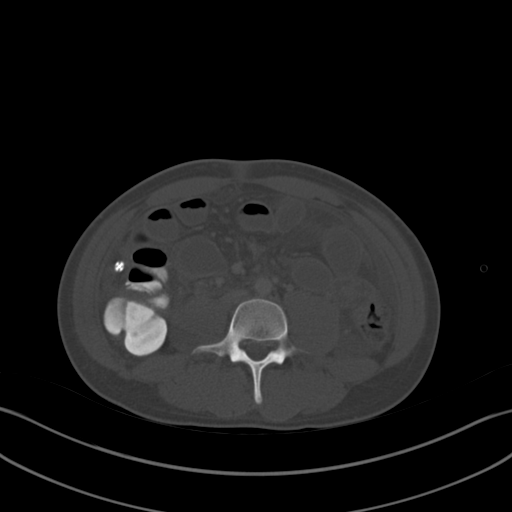
[im 55/61  soft-tissue]
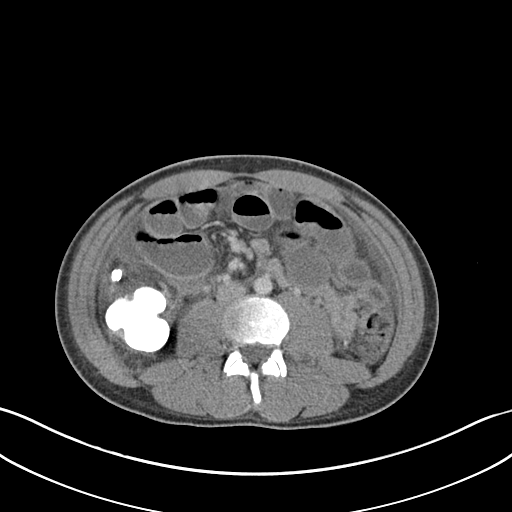
[im 55/61  lung]
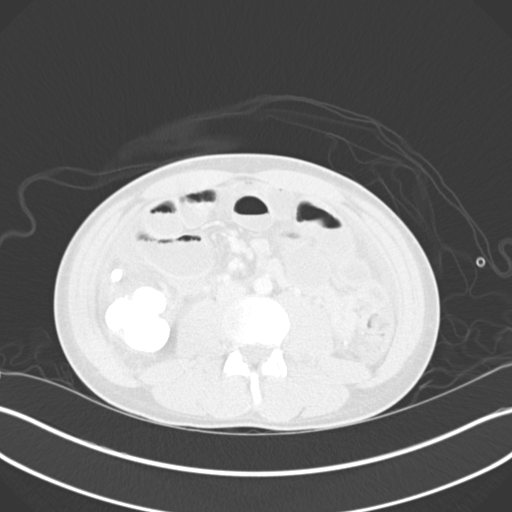

[12 of 32 positions shown; findings below may reference images not displayed]

EXAM:
CT-GUIDED RIGHT LOWER QUADRANT POSTOP ABSCESS DRAIN INSERTION

MEDICATIONS:
The patient is currently admitted to the hospital and receiving
intravenous antibiotics. The antibiotics were administered within an
appropriate time frame prior to the initiation of the procedure.

ANESTHESIA/SEDATION:
Fentanyl 100 mcg IV; Versed 3.0 mg IV

Moderate Sedation Time:  17 MINUTES

The patient was continuously monitored during the procedure by the
interventional radiology nurse under my direct supervision.

COMPLICATIONS:
None immediate.

PROCEDURE:
Informed written consent was obtained from the patient after a
thorough discussion of the procedural risks, benefits and
alternatives. All questions were addressed. Maximal Sterile Barrier
Technique was utilized including caps, mask, sterile gowns, sterile
gloves, sterile drape, hand hygiene and skin antiseptic. A timeout
was performed prior to the initiation of the procedure.

previous imaging reviewed. patient positioned supine. initial
noncontrast localization ct performed. the deep right lower quadrant
pelvic abscess was difficult to visualize without contrast.

100 cc contrast administered IV. repeat imaging performed of the
lower abdomen pelvis. the complex loculated abscess in the right
lower pelvis was localized. anterior percutaneous approach marked.

Under sterile conditions and local anesthesia, an 18 gauge 10 cm
access needle was advanced from a right lower quadrant anterior
approach into the fluid collection. Needle position confirmed with
CT. Syringe aspiration yielded purulent bloody fluid. Guidewire
inserted followed by tract dilatation to advance a 10 French drain.
Drain catheter confirmed in the deep portion of the fluid
collection. Syringe aspiration yielded 50 cc exudative fluid. Sample
sent for Gram stain and culture. Catheter secured with a Prolene
suture and connected to external suction bulb. No immediate
complication. Patient tolerated the procedure well.
IMPRESSION: Successful CT-guided right lower quadrant abscess drain insertion.

## 2018-03-23 ENCOUNTER — Telehealth: Payer: Self-pay

## 2018-03-24 NOTE — Telephone Encounter (Signed)
Closing encounter

## 2018-04-13 NOTE — Care Management (Signed)
Note entry to close encounter

## 2018-04-13 NOTE — Addendum Note (Signed)
Encounter addended by: Collie SiadJohnson, Breiona Couvillon, RN on: 04/13/2018 12:10 PM  Actions taken: Sign clinical note

## 2019-01-19 ENCOUNTER — Telehealth: Payer: Self-pay | Admitting: General Practice

## 2019-01-19 NOTE — Telephone Encounter (Signed)
error 

## 2019-02-27 ENCOUNTER — Emergency Department
Admission: EM | Admit: 2019-02-27 | Discharge: 2019-02-27 | Disposition: A | Discharge status: Home / Self Care | Payer: Self-pay | Attending: Emergency Medicine | Admitting: Emergency Medicine

## 2019-02-27 ENCOUNTER — Emergency Department: Payer: Self-pay

## 2019-02-27 ENCOUNTER — Other Ambulatory Visit: Payer: Self-pay

## 2019-02-27 DIAGNOSIS — S67196A Crushing injury of right little finger, initial encounter: Secondary | ICD-10-CM | POA: Insufficient documentation

## 2019-02-27 DIAGNOSIS — S6710XA Crushing injury of unspecified finger(s), initial encounter: Secondary | ICD-10-CM

## 2019-02-27 DIAGNOSIS — S67194A Crushing injury of right ring finger, initial encounter: Secondary | ICD-10-CM | POA: Insufficient documentation

## 2019-02-27 DIAGNOSIS — W230XXA Caught, crushed, jammed, or pinched between moving objects, initial encounter: Secondary | ICD-10-CM | POA: Insufficient documentation

## 2019-02-27 DIAGNOSIS — Y9389 Activity, other specified: Secondary | ICD-10-CM | POA: Insufficient documentation

## 2019-02-27 DIAGNOSIS — Y998 Other external cause status: Secondary | ICD-10-CM | POA: Insufficient documentation

## 2019-02-27 DIAGNOSIS — Y9289 Other specified places as the place of occurrence of the external cause: Secondary | ICD-10-CM | POA: Insufficient documentation

## 2019-02-27 NOTE — ED Notes (Signed)
Pt states he caught his right fifth finger in a cement mixer approximately an hour ago- wrapped it in a dirty sock and taped it- when sock was removed finger started bleeding around finger nail- placed 2x2 gauze on finger

## 2019-02-27 NOTE — Discharge Instructions (Addendum)
Return emergency department if any sign of infection or if symptoms are worsening.  No fractures were noted on your x-rays.  Keep the wound is clean and dry as possible.  Wear gloves when at work.

## 2019-02-27 NOTE — ED Triage Notes (Signed)
Pt states he smashed his finger with a cement mixer this morning. The 4th and 5th on the right hand

## 2019-02-27 NOTE — ED Provider Notes (Signed)
[facility] Emergency Department Provider Note  ____________________________________________   First MD Initiated Contact with Patient 02/27/19 [phone]     (approximate)  I have reviewed the triage vital signs and the nursing notes.   HISTORY  Chief Complaint Finger Injury    HPI Jimmy Leach is a 23 y.o. male presents emergency department complaining of right fifth finger pain.  He states that his finger got caught below a cement mixer probably an hour ago.  He states that there is an abrasion but he wants to make sure he did not break any bones.   Tdap is up-to-date   Past Medical History:  Diagnosis Date  . Acute appendicitis with appendiceal abscess 01/2017   required bilateral percutaneous drains  . ADHD   . Appendicitis   . Polysubstance abuse (HCC) 04/20/2014   Overview:  Cocaine, marijuana, Molly, mushrooms  . Ruptured appendicitis     Patient Active Problem List   Diagnosis Date Noted  . Acute appendicitis with peritoneal abscess   . Perforated appendicitis 06/23/2017  . Abscess   . Lower abdominal pain   . Appendicitis   . Postprocedural intraabdominal abscess 01/24/2017  . Ruptured appendicitis   . Polysubstance abuse (HCC) 04/20/2014    Past Surgical History:  Procedure Laterality Date  . LAPAROSCOPIC APPENDECTOMY N/A 01/14/2017   Procedure: LAPAROSCOPIC DRAINAGE OF PELVIC ABSCESS;  Surgeon: Ricarda Frame, MD;  Location: [facility] ORS;  Service: General;  Laterality: N/A;    Prior to Admission medications   Not on File    Allergies Patient has no known allergies.  Family History  Problem Relation Age of Onset  . Healthy Mother     Social History Social History   Tobacco Use  . Smoking status: Former Games developer  . Smokeless tobacco: Never Used  Substance Use Topics  . Alcohol use: Yes    Comment: Occasional  . Drug use: Yes    Types: Marijuana, Other-see comments    Comment: Mushrooms- Last use 02/03/17; Marajuana-  Last use 01/05/2018    Review of Systems  Constitutional: No fever/chills Eyes: No visual changes. ENT: No sore throat. Respiratory: Denies cough Genitourinary: Negative for dysuria. Musculoskeletal: Negative for back pain.  Positive for right hand pain Skin: Negative for rash.    ____________________________________________   PHYSICAL EXAM:  VITAL SIGNS: ED Triage Vitals  Enc Vitals Group     BP 02/27/19 0924 137/77     Pulse Rate 02/27/19 0924 67     Resp 02/27/19 0924 16     Temp 02/27/19 0924 98.3 F (36.8 C)     Temp Source 02/27/19 0924 Oral     SpO2 02/27/19 0924 98 %     Weight 02/27/19 0923 155 lb (70.3 kg)     Height 02/27/19 0923 5\' 7"  (1.702 m)     Head Circumference --      Peak Flow --      Pain Score 02/27/19 0923 8     Pain Loc --      Pain Edu? --      Excl. in [place]? --     Constitutional: Alert and oriented. Well appearing and in no acute distress. Eyes: Conjunctivae are normal.  Head: Atraumatic. Nose: No congestion/rhinnorhea. Mouth/Throat: Mucous membranes are moist.   Neck:  supple no lymphadenopathy noted Cardiovascular: Normal rate, regular rhythm.  Respiratory: Normal respiratory effort.  No retractions GU: deferred Musculoskeletal: FROM all extremities, warm and well perfused, abrasions noted at the distal right fifth  finger.  Neurovascular intact Neurologic:  Normal speech and language.  Skin:  Skin is warm, dry , abrasions noted on the finger and hand no rash noted. Psychiatric: Mood and affect are normal. Speech and behavior are normal.  ____________________________________________   LABS (all labs ordered are listed, but only abnormal results are displayed)  Labs Reviewed - No data to display ____________________________________________   ____________________________________________  RADIOLOGY  X-ray of the right hand is normal  ____________________________________________   PROCEDURES  Procedure(s) performed: The  right hand was soaked in Betadine and saline, cleaned and dressed, sutures were not needed   Procedures    ____________________________________________   INITIAL IMPRESSION / ASSESSMENT AND PLAN / ED COURSE  Pertinent labs & imaging results that were available during my care of the patient were reviewed by me and considered in my medical decision making (see chart for details).   Patient is a 23 year old male presents emergency department with complaints of a crush injury to the right hand.  Right hand is tender along the right fifth and fourth fingers.  Multiple abrasions are noted.  X-ray of the right hand is negative  The hand was soaked in Betadine and saline.  Area was dressed.  Patient is to return emergency department worsening or follow-up with his regular doctor.  Keep the hand is clean and dry as possible while at work.  His Tdap is up-to-date.  He was discharged in stable condition.     As part of my medical decision making, I reviewed the following data within the electronic MEDICAL RECORD NUMBER Nursing notes reviewed and incorporated, Old chart reviewed, Radiograph reviewed x-ray of the right hand is negative, Notes from prior ED visits and [place] Controlled Substance Database  ____________________________________________   FINAL CLINICAL IMPRESSION(S) / ED DIAGNOSES  Final diagnoses:  Crushing injury of finger, initial encounter      NEW MEDICATIONS STARTED DURING THIS VISIT:  New Prescriptions   No medications on file     Note:  This document was prepared using Dragon voice recognition software and may include unintentional dictation errors.    Faythe GheeFisher, Susan W, PA-C 02/27/19 1038    Emily FilbertWilliams, Jonathan E, MD 02/27/19 [phone]

## 2022-12-23 ENCOUNTER — Emergency Department
Admission: EM | Admit: 2022-12-23 | Discharge: 2022-12-23 | Disposition: A | Discharge status: Home / Self Care | Payer: Medicaid Other | Attending: Emergency Medicine | Admitting: Emergency Medicine

## 2022-12-23 ENCOUNTER — Other Ambulatory Visit: Payer: Self-pay

## 2022-12-23 DIAGNOSIS — M25561 Pain in right knee: Secondary | ICD-10-CM | POA: Insufficient documentation

## 2022-12-23 DIAGNOSIS — Y92410 Unspecified street and highway as the place of occurrence of the external cause: Secondary | ICD-10-CM | POA: Diagnosis not present

## 2022-12-23 DIAGNOSIS — S8991XA Unspecified injury of right lower leg, initial encounter: Secondary | ICD-10-CM | POA: Diagnosis present

## 2022-12-23 DIAGNOSIS — Z87828 Personal history of other (healed) physical injury and trauma: Secondary | ICD-10-CM

## 2022-12-23 DIAGNOSIS — S83511A Sprain of anterior cruciate ligament of right knee, initial encounter: Secondary | ICD-10-CM | POA: Insufficient documentation

## 2022-12-23 DIAGNOSIS — G8929 Other chronic pain: Secondary | ICD-10-CM

## 2022-12-23 MED ORDER — METHOCARBAMOL 500 MG PO TABS: 500.0000 mg | ORAL_TABLET | Freq: Three times a day (TID) | ORAL | 1 refills | Status: AC | PRN

## 2022-12-23 MED ORDER — NAPROXEN 500 MG PO TABS: 500.0000 mg | ORAL_TABLET | Freq: Two times a day (BID) | ORAL | 1 refills | Status: AC

## 2022-12-23 NOTE — Discharge Instructions (Addendum)
Your exam is reassuring at this time.  No signs of joint swelling or significant laxity.  Continue to wear your brace as needed.  Take the prescription meds as directed.  Follow-up with [facility] or [facility] for further evaluation as discussed.
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
## 2023-05-29 ENCOUNTER — Emergency Department
Admission: EM | Admit: 2023-05-29 | Discharge: 2023-05-29 | Disposition: A | Discharge status: Home / Self Care | Payer: MEDICAID | Attending: Emergency Medicine | Admitting: Emergency Medicine

## 2023-05-29 ENCOUNTER — Other Ambulatory Visit: Payer: Self-pay

## 2023-05-29 DIAGNOSIS — R112 Nausea with vomiting, unspecified: Secondary | ICD-10-CM | POA: Insufficient documentation

## 2023-05-29 DIAGNOSIS — R1084 Generalized abdominal pain: Secondary | ICD-10-CM | POA: Diagnosis not present

## 2023-05-29 LAB — COMPREHENSIVE METABOLIC PANEL
ALT: 27 U/L (ref 0–44)
AST: 24 U/L (ref 15–41)
Albumin: 4.5 g/dL (ref 3.5–5.0)
Alkaline Phosphatase: 85 U/L (ref 38–126)
Anion gap: 8 (ref 5–15)
BUN: 16 mg/dL (ref 6–20)
CO2: 25 mmol/L (ref 22–32)
Calcium: 9.5 mg/dL (ref 8.9–10.3)
Chloride: 104 mmol/L (ref 98–111)
Creatinine, Ser: 0.9 mg/dL (ref 0.61–1.24)
GFR, Estimated: >60 mL/min (ref >60)
Glucose, Bld: 121 mg/dL — ABNORMAL HIGH (ref 70–99)
Potassium: 3.5 mmol/L (ref 3.5–5.1)
Sodium: 137 mmol/L (ref 135–145)
Total Bilirubin: 0.5 mg/dL (ref 0.3–1.2)
Total Protein: 8.1 g/dL (ref 6.5–8.1)

## 2023-05-29 LAB — LIPASE, BLOOD: Lipase: 32 U/L (ref 11–51)

## 2023-05-29 LAB — URINALYSIS, ROUTINE W REFLEX MICROSCOPIC
Bacteria, UA: NONE SEEN
Bilirubin Urine: NEGATIVE
Glucose, UA: NEGATIVE mg/dL
Hgb urine dipstick: NEGATIVE
Ketones, ur: NEGATIVE mg/dL
Leukocytes,Ua: NEGATIVE
Nitrite: NEGATIVE
Protein, ur: NEGATIVE mg/dL
Specific Gravity, Urine: 1.013 (ref 1.005–1.030)
Squamous Epithelial / HPF: NONE SEEN /HPF (ref 0–5)
WBC, UA: NONE SEEN WBC/hpf (ref 0–5)
pH: 8 (ref 5.0–8.0)

## 2023-05-29 LAB — CBC
HCT: 45.5 % (ref 39.0–52.0)
Hemoglobin: 15.7 g/dL (ref 13.0–17.0)
MCH: 30 pg (ref 26.0–34.0)
MCHC: 34.5 g/dL (ref 30.0–36.0)
MCV: 87 fL (ref 80.0–100.0)
Platelets: 367 10*3/uL (ref 150–400)
RBC: 5.23 MIL/uL (ref 4.22–5.81)
RDW: 11.6 % (ref 11.5–15.5)
WBC: 9.3 10*3/uL (ref 4.0–10.5)
nRBC: 0 % (ref 0.0–0.2)

## 2023-05-29 MED ORDER — ONDANSETRON 4 MG PO TBDP: 4.0000 mg | ORAL_TABLET | Freq: Three times a day (TID) | ORAL | 0 refills | Status: AC | PRN

## 2023-05-29 MED ORDER — IBUPROFEN 600 MG PO TABS
600.0000 mg | ORAL_TABLET | Freq: Once | ORAL | Status: AC
  Administered 2023-05-29: 600 mg via ORAL
  Filled 2023-05-29: qty 1

## 2023-05-29 MED ORDER — ONDANSETRON HCL 4 MG/2ML IJ SOLN
4.0000 mg | Freq: Once | INTRAMUSCULAR | Status: AC
  Administered 2023-05-29: 4 mg via INTRAVENOUS
  Filled 2023-05-29: qty 2

## 2023-05-29 MED ORDER — SODIUM CHLORIDE 0.9 % IV BOLUS
1000.0000 mL | Freq: Once | INTRAVENOUS | Status: AC
  Administered 2023-05-29: 1000 mL via INTRAVENOUS

## 2023-05-29 NOTE — ED Provider Notes (Signed)
[facility] Provider Note    Event Date/Time   First MD Initiated Contact with Patient 05/29/23 0802     (approximate)   History   Chief Complaint: Abdominal Pain   HPI  Jimmy Leach is a 27 y.o. male with a history of polysubstance abuse who comes to the ED complaining of generalized abdominal pain and nausea and vomiting that started this morning.  Patient reports that yesterday he was "supporting a friend" by going to a heavy metal concert where his friend's brother was performing.  He drank alcohol and snorted ketamine.  Denies any injection drug use or other drug use.  No black or bloody emesis or stool.  No chest pain or shortness of breath or fever.     Physical Exam   Triage Vital Signs: ED Triage Vitals  Encounter Vitals Group     BP 05/29/23 0741 (!) 196/125     Systolic BP Percentile --      Diastolic BP Percentile --      Pulse Rate 05/29/23 0739 74     Resp 05/29/23 0739 17     Temp 05/29/23 0739 97.9 F (36.6 C)     Temp Source 05/29/23 0739 Oral     SpO2 05/29/23 0739 98 %     Weight 05/29/23 0739 155 lb (70.3 kg)     Height 05/29/23 0739 5\' 8"  (1.727 m)     Head Circumference --      Peak Flow --      Pain Score 05/29/23 0739 10     Pain Loc --      Pain Education --      Exclude from Growth Chart --     Most recent vital signs: Vitals:   05/29/23 0900 05/29/23 1000  BP: (!) 142/104 (!) 152/103  Pulse: 64 63  Resp: 18 16  Temp:    SpO2: 100% 100%    General: Awake, no distress.  CV:  Good peripheral perfusion.  Regular rate and rhythm Resp:  Normal effort.  Clear to auscultation bilaterally Abd:  No distention.  Soft nontender Other:  Dry oral mucosa   ED Results / Procedures / Treatments   Labs (all labs ordered are listed, but only abnormal results are displayed) Labs Reviewed  COMPREHENSIVE METABOLIC PANEL - Abnormal; Notable for the following components:      Result Value   Glucose, Bld 121 (*)     All other components within normal limits  URINALYSIS, ROUTINE W REFLEX MICROSCOPIC - Abnormal; Notable for the following components:   Color, Urine YELLOW (*)    APPearance TURBID (*)    All other components within normal limits  LIPASE, BLOOD  CBC     EKG    RADIOLOGY    PROCEDURES:  Procedures   MEDICATIONS ORDERED IN ED: Medications  ondansetron (ZOFRAN) injection 4 mg (4 mg Intravenous Given 05/29/23 0853)  sodium chloride 0.9 % bolus 1,000 mL (1,000 mLs Intravenous New Bag/Given 05/29/23 0853)     IMPRESSION / MDM / ASSESSMENT AND PLAN / ED COURSE  I reviewed the triage vital signs and the nursing notes.  DDx: Dehydration, AKI, electrolyte abnormality, alcoholic gastritis  Patient's presentation is most consistent with acute presentation with potential threat to life or bodily function.  Patient presents with abdominal pain and vomiting this morning.  Abdomen is benign, exam is reassuring.  Patient given IV fluids for hydration, Zofran for symptom relief.  Stable for discharge   Considering  the patient's symptoms, medical history, and physical examination today, I have low suspicion for cholecystitis or biliary pathology, pancreatitis, perforation or bowel obstruction, hernia, intra-abdominal abscess, AAA or dissection, volvulus or intussusception, mesenteric ischemia, or appendicitis.        FINAL CLINICAL IMPRESSION(S) / ED DIAGNOSES   Final diagnoses:  Nausea and vomiting, unspecified vomiting type     Rx / DC Orders   ED Discharge Orders          Ordered    ondansetron (ZOFRAN-ODT) 4 MG disintegrating tablet  Every 8 hours PRN        05/29/23 1041             Note:  This document was prepared using Dragon voice recognition software and may include unintentional dictation errors.   Sharman Cheek, MD 05/29/23 1044

## 2023-05-29 NOTE — ED Triage Notes (Addendum)
Pt presents to ED with c/o of ABD pain, pt states this has been ongoing for a few days. Pt states he was around his grandpa who has COVID. NAD noted. Pt drinking on arrival to triage pt asked to wait until seen by provider to eat or drink. PT denies fevers or chills. Pt states last BM yesterday and does endorse dysuria.   Pt hypertensive in triage after multiple BPs and changing arms (no HX) pt still hypertensive and when asked about drug use states he took ketamine 3-6 hours ago.
